# Patient Record
Sex: Female | Born: 1935 | Race: White | Hispanic: No | Smoking: Never smoker
Health system: Southern US, Community
[De-identification: ages and names within clinical notes are randomized; demographics above are authoritative.]

## PROBLEM LIST (undated history)

## (undated) DIAGNOSIS — K219 Gastro-esophageal reflux disease without esophagitis: Secondary | ICD-10-CM

## (undated) DIAGNOSIS — I4891 Unspecified atrial fibrillation: Secondary | ICD-10-CM

## (undated) DIAGNOSIS — C539 Malignant neoplasm of cervix uteri, unspecified: Secondary | ICD-10-CM

## (undated) DIAGNOSIS — E039 Hypothyroidism, unspecified: Secondary | ICD-10-CM

## (undated) DIAGNOSIS — K449 Diaphragmatic hernia without obstruction or gangrene: Secondary | ICD-10-CM

## (undated) DIAGNOSIS — I10 Essential (primary) hypertension: Secondary | ICD-10-CM

## (undated) DIAGNOSIS — K5792 Diverticulitis of intestine, part unspecified, without perforation or abscess without bleeding: Secondary | ICD-10-CM

## (undated) HISTORY — DX: Malignant neoplasm of cervix uteri, unspecified: C53.9

## (undated) HISTORY — DX: Unspecified atrial fibrillation: I48.91

## (undated) HISTORY — DX: Diaphragmatic hernia without obstruction or gangrene: K44.9

## (undated) HISTORY — DX: Gastro-esophageal reflux disease without esophagitis: K21.9

## (undated) HISTORY — PX: OOPHORECTOMY: SHX86

## (undated) HISTORY — PX: BREAST LUMPECTOMY: SHX2

## (undated) HISTORY — DX: Hypothyroidism, unspecified: E03.9

## (undated) HISTORY — PX: OTHER SURGICAL HISTORY: SHX169

## (undated) HISTORY — PX: VAGINAL HYSTERECTOMY: SHX2639

## (undated) HISTORY — DX: Essential (primary) hypertension: I10

---

## 2011-11-30 DIAGNOSIS — L8992 Pressure ulcer of unspecified site, stage 2: Secondary | ICD-10-CM | POA: Diagnosis not present

## 2011-11-30 DIAGNOSIS — L89509 Pressure ulcer of unspecified ankle, unspecified stage: Secondary | ICD-10-CM | POA: Diagnosis not present

## 2011-12-01 DIAGNOSIS — H2510 Age-related nuclear cataract, unspecified eye: Secondary | ICD-10-CM | POA: Diagnosis not present

## 2011-12-05 DIAGNOSIS — I8310 Varicose veins of unspecified lower extremity with inflammation: Secondary | ICD-10-CM | POA: Diagnosis not present

## 2011-12-05 DIAGNOSIS — R609 Edema, unspecified: Secondary | ICD-10-CM | POA: Diagnosis not present

## 2011-12-05 DIAGNOSIS — L97909 Non-pressure chronic ulcer of unspecified part of unspecified lower leg with unspecified severity: Secondary | ICD-10-CM | POA: Diagnosis not present

## 2011-12-12 DIAGNOSIS — R609 Edema, unspecified: Secondary | ICD-10-CM | POA: Diagnosis not present

## 2011-12-12 DIAGNOSIS — I8310 Varicose veins of unspecified lower extremity with inflammation: Secondary | ICD-10-CM | POA: Diagnosis not present

## 2011-12-12 DIAGNOSIS — L97909 Non-pressure chronic ulcer of unspecified part of unspecified lower leg with unspecified severity: Secondary | ICD-10-CM | POA: Diagnosis not present

## 2011-12-21 DIAGNOSIS — R040 Epistaxis: Secondary | ICD-10-CM | POA: Diagnosis not present

## 2012-01-03 DIAGNOSIS — R040 Epistaxis: Secondary | ICD-10-CM | POA: Diagnosis not present

## 2012-01-04 DIAGNOSIS — L97909 Non-pressure chronic ulcer of unspecified part of unspecified lower leg with unspecified severity: Secondary | ICD-10-CM | POA: Diagnosis not present

## 2012-01-09 DIAGNOSIS — S63509A Unspecified sprain of unspecified wrist, initial encounter: Secondary | ICD-10-CM | POA: Diagnosis not present

## 2012-01-22 DIAGNOSIS — R609 Edema, unspecified: Secondary | ICD-10-CM | POA: Diagnosis not present

## 2012-01-22 DIAGNOSIS — L03119 Cellulitis of unspecified part of limb: Secondary | ICD-10-CM | POA: Diagnosis not present

## 2012-01-22 DIAGNOSIS — L97909 Non-pressure chronic ulcer of unspecified part of unspecified lower leg with unspecified severity: Secondary | ICD-10-CM | POA: Diagnosis not present

## 2012-01-22 DIAGNOSIS — L02419 Cutaneous abscess of limb, unspecified: Secondary | ICD-10-CM | POA: Diagnosis not present

## 2012-01-26 DIAGNOSIS — L02419 Cutaneous abscess of limb, unspecified: Secondary | ICD-10-CM | POA: Diagnosis not present

## 2012-01-26 DIAGNOSIS — L0390 Cellulitis, unspecified: Secondary | ICD-10-CM | POA: Diagnosis not present

## 2012-01-26 DIAGNOSIS — L0291 Cutaneous abscess, unspecified: Secondary | ICD-10-CM | POA: Diagnosis not present

## 2012-01-26 DIAGNOSIS — L97909 Non-pressure chronic ulcer of unspecified part of unspecified lower leg with unspecified severity: Secondary | ICD-10-CM | POA: Diagnosis not present

## 2012-01-26 DIAGNOSIS — L03119 Cellulitis of unspecified part of limb: Secondary | ICD-10-CM | POA: Diagnosis not present

## 2012-01-30 DIAGNOSIS — L03119 Cellulitis of unspecified part of limb: Secondary | ICD-10-CM | POA: Diagnosis not present

## 2012-01-30 DIAGNOSIS — L02419 Cutaneous abscess of limb, unspecified: Secondary | ICD-10-CM | POA: Diagnosis not present

## 2012-02-06 DIAGNOSIS — L03119 Cellulitis of unspecified part of limb: Secondary | ICD-10-CM | POA: Diagnosis not present

## 2012-02-06 DIAGNOSIS — L02419 Cutaneous abscess of limb, unspecified: Secondary | ICD-10-CM | POA: Diagnosis not present

## 2012-02-06 DIAGNOSIS — L97909 Non-pressure chronic ulcer of unspecified part of unspecified lower leg with unspecified severity: Secondary | ICD-10-CM | POA: Diagnosis not present

## 2012-02-23 DIAGNOSIS — R040 Epistaxis: Secondary | ICD-10-CM | POA: Diagnosis not present

## 2012-03-18 DIAGNOSIS — B029 Zoster without complications: Secondary | ICD-10-CM | POA: Diagnosis not present

## 2012-09-20 DIAGNOSIS — T6391XA Toxic effect of contact with unspecified venomous animal, accidental (unintentional), initial encounter: Secondary | ICD-10-CM | POA: Diagnosis not present

## 2012-12-06 DIAGNOSIS — H2510 Age-related nuclear cataract, unspecified eye: Secondary | ICD-10-CM | POA: Diagnosis not present

## 2013-01-23 DIAGNOSIS — H6040 Cholesteatoma of external ear, unspecified ear: Secondary | ICD-10-CM | POA: Diagnosis not present

## 2013-01-23 DIAGNOSIS — H60399 Other infective otitis externa, unspecified ear: Secondary | ICD-10-CM | POA: Diagnosis not present

## 2013-01-23 DIAGNOSIS — J029 Acute pharyngitis, unspecified: Secondary | ICD-10-CM | POA: Diagnosis not present

## 2013-02-06 DIAGNOSIS — R1314 Dysphagia, pharyngoesophageal phase: Secondary | ICD-10-CM | POA: Diagnosis not present

## 2013-02-06 DIAGNOSIS — H60399 Other infective otitis externa, unspecified ear: Secondary | ICD-10-CM | POA: Diagnosis not present

## 2013-02-19 DIAGNOSIS — R1314 Dysphagia, pharyngoesophageal phase: Secondary | ICD-10-CM | POA: Diagnosis not present

## 2013-02-19 DIAGNOSIS — IMO0001 Reserved for inherently not codable concepts without codable children: Secondary | ICD-10-CM | POA: Diagnosis not present

## 2013-03-10 DIAGNOSIS — R1314 Dysphagia, pharyngoesophageal phase: Secondary | ICD-10-CM | POA: Diagnosis not present

## 2013-03-10 DIAGNOSIS — H60399 Other infective otitis externa, unspecified ear: Secondary | ICD-10-CM | POA: Diagnosis not present

## 2013-03-21 DIAGNOSIS — R1310 Dysphagia, unspecified: Secondary | ICD-10-CM | POA: Diagnosis not present

## 2013-03-21 DIAGNOSIS — K219 Gastro-esophageal reflux disease without esophagitis: Secondary | ICD-10-CM | POA: Diagnosis not present

## 2013-03-26 DIAGNOSIS — R1310 Dysphagia, unspecified: Secondary | ICD-10-CM | POA: Diagnosis not present

## 2013-03-26 DIAGNOSIS — K224 Dyskinesia of esophagus: Secondary | ICD-10-CM | POA: Diagnosis not present

## 2013-03-26 DIAGNOSIS — K449 Diaphragmatic hernia without obstruction or gangrene: Secondary | ICD-10-CM | POA: Diagnosis not present

## 2013-06-09 DIAGNOSIS — H2700 Aphakia, unspecified eye: Secondary | ICD-10-CM | POA: Diagnosis not present

## 2013-07-03 DIAGNOSIS — Z7982 Long term (current) use of aspirin: Secondary | ICD-10-CM | POA: Diagnosis not present

## 2013-07-03 DIAGNOSIS — J342 Deviated nasal septum: Secondary | ICD-10-CM | POA: Diagnosis not present

## 2013-07-03 DIAGNOSIS — R040 Epistaxis: Secondary | ICD-10-CM | POA: Diagnosis not present

## 2013-07-03 DIAGNOSIS — I10 Essential (primary) hypertension: Secondary | ICD-10-CM | POA: Diagnosis not present

## 2013-07-04 DIAGNOSIS — I10 Essential (primary) hypertension: Secondary | ICD-10-CM | POA: Diagnosis not present

## 2013-07-11 DIAGNOSIS — R040 Epistaxis: Secondary | ICD-10-CM | POA: Diagnosis not present

## 2013-08-13 DIAGNOSIS — E559 Vitamin D deficiency, unspecified: Secondary | ICD-10-CM | POA: Diagnosis not present

## 2013-08-13 DIAGNOSIS — M1990 Unspecified osteoarthritis, unspecified site: Secondary | ICD-10-CM | POA: Diagnosis not present

## 2013-08-13 DIAGNOSIS — I10 Essential (primary) hypertension: Secondary | ICD-10-CM | POA: Diagnosis not present

## 2013-09-05 DIAGNOSIS — R1310 Dysphagia, unspecified: Secondary | ICD-10-CM | POA: Diagnosis not present

## 2013-09-05 DIAGNOSIS — H6040 Cholesteatoma of external ear, unspecified ear: Secondary | ICD-10-CM | POA: Diagnosis not present

## 2013-09-19 DIAGNOSIS — I10 Essential (primary) hypertension: Secondary | ICD-10-CM | POA: Diagnosis not present

## 2013-09-19 DIAGNOSIS — E559 Vitamin D deficiency, unspecified: Secondary | ICD-10-CM | POA: Diagnosis not present

## 2013-10-22 DIAGNOSIS — I10 Essential (primary) hypertension: Secondary | ICD-10-CM | POA: Diagnosis not present

## 2013-10-22 DIAGNOSIS — M1990 Unspecified osteoarthritis, unspecified site: Secondary | ICD-10-CM | POA: Diagnosis not present

## 2013-10-22 DIAGNOSIS — E559 Vitamin D deficiency, unspecified: Secondary | ICD-10-CM | POA: Diagnosis not present

## 2013-12-23 DIAGNOSIS — M25519 Pain in unspecified shoulder: Secondary | ICD-10-CM | POA: Diagnosis not present

## 2013-12-23 DIAGNOSIS — E559 Vitamin D deficiency, unspecified: Secondary | ICD-10-CM | POA: Diagnosis not present

## 2013-12-23 DIAGNOSIS — I10 Essential (primary) hypertension: Secondary | ICD-10-CM | POA: Diagnosis not present

## 2013-12-23 DIAGNOSIS — M1990 Unspecified osteoarthritis, unspecified site: Secondary | ICD-10-CM | POA: Diagnosis not present

## 2014-02-23 DIAGNOSIS — I10 Essential (primary) hypertension: Secondary | ICD-10-CM | POA: Diagnosis not present

## 2014-02-23 DIAGNOSIS — M1990 Unspecified osteoarthritis, unspecified site: Secondary | ICD-10-CM | POA: Diagnosis not present

## 2014-02-23 DIAGNOSIS — E559 Vitamin D deficiency, unspecified: Secondary | ICD-10-CM | POA: Diagnosis not present

## 2014-03-13 DIAGNOSIS — R1319 Other dysphagia: Secondary | ICD-10-CM | POA: Diagnosis not present

## 2014-03-13 DIAGNOSIS — T18128A Food in esophagus causing other injury, initial encounter: Secondary | ICD-10-CM | POA: Diagnosis not present

## 2014-03-13 DIAGNOSIS — I10 Essential (primary) hypertension: Secondary | ICD-10-CM | POA: Diagnosis not present

## 2014-03-13 DIAGNOSIS — R1310 Dysphagia, unspecified: Secondary | ICD-10-CM | POA: Diagnosis not present

## 2014-03-13 DIAGNOSIS — T18108A Unspecified foreign body in esophagus causing other injury, initial encounter: Secondary | ICD-10-CM | POA: Diagnosis not present

## 2014-03-13 DIAGNOSIS — J029 Acute pharyngitis, unspecified: Secondary | ICD-10-CM | POA: Diagnosis not present

## 2014-03-13 DIAGNOSIS — K449 Diaphragmatic hernia without obstruction or gangrene: Secondary | ICD-10-CM | POA: Diagnosis not present

## 2014-03-15 DIAGNOSIS — T18108A Unspecified foreign body in esophagus causing other injury, initial encounter: Secondary | ICD-10-CM | POA: Diagnosis not present

## 2014-03-15 DIAGNOSIS — T18128A Food in esophagus causing other injury, initial encounter: Secondary | ICD-10-CM | POA: Diagnosis not present

## 2014-03-15 DIAGNOSIS — K449 Diaphragmatic hernia without obstruction or gangrene: Secondary | ICD-10-CM | POA: Diagnosis not present

## 2014-03-15 DIAGNOSIS — R1310 Dysphagia, unspecified: Secondary | ICD-10-CM | POA: Diagnosis not present

## 2014-03-15 DIAGNOSIS — R1319 Other dysphagia: Secondary | ICD-10-CM | POA: Diagnosis not present

## 2014-03-16 DIAGNOSIS — F419 Anxiety disorder, unspecified: Secondary | ICD-10-CM | POA: Diagnosis not present

## 2014-03-16 DIAGNOSIS — I10 Essential (primary) hypertension: Secondary | ICD-10-CM | POA: Diagnosis not present

## 2014-03-20 DIAGNOSIS — K219 Gastro-esophageal reflux disease without esophagitis: Secondary | ICD-10-CM | POA: Diagnosis not present

## 2014-03-20 DIAGNOSIS — R1314 Dysphagia, pharyngoesophageal phase: Secondary | ICD-10-CM | POA: Diagnosis not present

## 2014-03-20 DIAGNOSIS — I10 Essential (primary) hypertension: Secondary | ICD-10-CM | POA: Diagnosis not present

## 2014-04-05 DIAGNOSIS — R569 Unspecified convulsions: Secondary | ICD-10-CM | POA: Diagnosis not present

## 2014-04-05 DIAGNOSIS — I10 Essential (primary) hypertension: Secondary | ICD-10-CM | POA: Diagnosis not present

## 2014-04-05 DIAGNOSIS — R404 Transient alteration of awareness: Secondary | ICD-10-CM | POA: Diagnosis not present

## 2014-04-05 DIAGNOSIS — R42 Dizziness and giddiness: Secondary | ICD-10-CM | POA: Diagnosis not present

## 2014-04-05 DIAGNOSIS — R55 Syncope and collapse: Secondary | ICD-10-CM | POA: Diagnosis not present

## 2014-04-14 DIAGNOSIS — R1310 Dysphagia, unspecified: Secondary | ICD-10-CM | POA: Diagnosis not present

## 2014-04-14 DIAGNOSIS — I10 Essential (primary) hypertension: Secondary | ICD-10-CM | POA: Diagnosis not present

## 2014-04-30 DIAGNOSIS — K222 Esophageal obstruction: Secondary | ICD-10-CM | POA: Diagnosis not present

## 2014-04-30 DIAGNOSIS — I4891 Unspecified atrial fibrillation: Secondary | ICD-10-CM | POA: Diagnosis not present

## 2014-04-30 DIAGNOSIS — K449 Diaphragmatic hernia without obstruction or gangrene: Secondary | ICD-10-CM | POA: Diagnosis not present

## 2014-04-30 DIAGNOSIS — I10 Essential (primary) hypertension: Secondary | ICD-10-CM | POA: Diagnosis not present

## 2014-04-30 DIAGNOSIS — E039 Hypothyroidism, unspecified: Secondary | ICD-10-CM | POA: Diagnosis not present

## 2014-04-30 DIAGNOSIS — F419 Anxiety disorder, unspecified: Secondary | ICD-10-CM | POA: Diagnosis not present

## 2014-04-30 DIAGNOSIS — R1314 Dysphagia, pharyngoesophageal phase: Secondary | ICD-10-CM | POA: Diagnosis not present

## 2014-04-30 DIAGNOSIS — K219 Gastro-esophageal reflux disease without esophagitis: Secondary | ICD-10-CM | POA: Diagnosis not present

## 2014-05-04 DIAGNOSIS — R1310 Dysphagia, unspecified: Secondary | ICD-10-CM | POA: Diagnosis not present

## 2014-05-04 DIAGNOSIS — I10 Essential (primary) hypertension: Secondary | ICD-10-CM | POA: Diagnosis not present

## 2014-06-11 DIAGNOSIS — K219 Gastro-esophageal reflux disease without esophagitis: Secondary | ICD-10-CM | POA: Diagnosis not present

## 2014-06-11 DIAGNOSIS — R1314 Dysphagia, pharyngoesophageal phase: Secondary | ICD-10-CM | POA: Diagnosis not present

## 2014-06-11 DIAGNOSIS — K449 Diaphragmatic hernia without obstruction or gangrene: Secondary | ICD-10-CM | POA: Diagnosis not present

## 2014-06-11 DIAGNOSIS — I10 Essential (primary) hypertension: Secondary | ICD-10-CM | POA: Diagnosis not present

## 2014-06-11 DIAGNOSIS — E039 Hypothyroidism, unspecified: Secondary | ICD-10-CM | POA: Diagnosis not present

## 2014-06-11 DIAGNOSIS — K222 Esophageal obstruction: Secondary | ICD-10-CM | POA: Diagnosis not present

## 2014-06-11 DIAGNOSIS — I4891 Unspecified atrial fibrillation: Secondary | ICD-10-CM | POA: Diagnosis not present

## 2014-07-03 DIAGNOSIS — E559 Vitamin D deficiency, unspecified: Secondary | ICD-10-CM | POA: Diagnosis not present

## 2014-07-03 DIAGNOSIS — I10 Essential (primary) hypertension: Secondary | ICD-10-CM | POA: Diagnosis not present

## 2014-07-03 DIAGNOSIS — R1310 Dysphagia, unspecified: Secondary | ICD-10-CM | POA: Diagnosis not present

## 2014-07-03 DIAGNOSIS — M1990 Unspecified osteoarthritis, unspecified site: Secondary | ICD-10-CM | POA: Diagnosis not present

## 2014-07-09 DIAGNOSIS — R1314 Dysphagia, pharyngoesophageal phase: Secondary | ICD-10-CM | POA: Diagnosis not present

## 2014-07-09 DIAGNOSIS — K222 Esophageal obstruction: Secondary | ICD-10-CM | POA: Diagnosis not present

## 2014-07-09 DIAGNOSIS — K449 Diaphragmatic hernia without obstruction or gangrene: Secondary | ICD-10-CM | POA: Diagnosis not present

## 2014-07-09 DIAGNOSIS — Z8541 Personal history of malignant neoplasm of cervix uteri: Secondary | ICD-10-CM | POA: Diagnosis not present

## 2014-07-09 DIAGNOSIS — F419 Anxiety disorder, unspecified: Secondary | ICD-10-CM | POA: Diagnosis not present

## 2014-07-09 DIAGNOSIS — Z90710 Acquired absence of both cervix and uterus: Secondary | ICD-10-CM | POA: Diagnosis not present

## 2014-07-09 DIAGNOSIS — K219 Gastro-esophageal reflux disease without esophagitis: Secondary | ICD-10-CM | POA: Diagnosis not present

## 2014-07-09 DIAGNOSIS — I4891 Unspecified atrial fibrillation: Secondary | ICD-10-CM | POA: Diagnosis not present

## 2014-07-09 DIAGNOSIS — I10 Essential (primary) hypertension: Secondary | ICD-10-CM | POA: Diagnosis not present

## 2014-07-09 DIAGNOSIS — E039 Hypothyroidism, unspecified: Secondary | ICD-10-CM | POA: Diagnosis not present

## 2014-07-09 HISTORY — PX: ESOPHAGOGASTRODUODENOSCOPY: SHX1529

## 2014-08-04 DIAGNOSIS — K449 Diaphragmatic hernia without obstruction or gangrene: Secondary | ICD-10-CM | POA: Diagnosis not present

## 2014-08-04 DIAGNOSIS — K222 Esophageal obstruction: Secondary | ICD-10-CM | POA: Diagnosis not present

## 2014-08-27 DIAGNOSIS — H2513 Age-related nuclear cataract, bilateral: Secondary | ICD-10-CM | POA: Diagnosis not present

## 2014-09-02 DIAGNOSIS — I10 Essential (primary) hypertension: Secondary | ICD-10-CM | POA: Diagnosis not present

## 2014-09-02 DIAGNOSIS — M1990 Unspecified osteoarthritis, unspecified site: Secondary | ICD-10-CM | POA: Diagnosis not present

## 2014-09-02 DIAGNOSIS — E559 Vitamin D deficiency, unspecified: Secondary | ICD-10-CM | POA: Diagnosis not present

## 2014-09-02 DIAGNOSIS — R1310 Dysphagia, unspecified: Secondary | ICD-10-CM | POA: Diagnosis not present

## 2014-11-04 DIAGNOSIS — I10 Essential (primary) hypertension: Secondary | ICD-10-CM | POA: Diagnosis not present

## 2014-11-04 DIAGNOSIS — E559 Vitamin D deficiency, unspecified: Secondary | ICD-10-CM | POA: Diagnosis not present

## 2014-11-10 DIAGNOSIS — E039 Hypothyroidism, unspecified: Secondary | ICD-10-CM | POA: Diagnosis not present

## 2014-11-10 DIAGNOSIS — M19012 Primary osteoarthritis, left shoulder: Secondary | ICD-10-CM | POA: Diagnosis not present

## 2014-11-10 DIAGNOSIS — M179 Osteoarthritis of knee, unspecified: Secondary | ICD-10-CM | POA: Diagnosis not present

## 2014-11-10 DIAGNOSIS — I10 Essential (primary) hypertension: Secondary | ICD-10-CM | POA: Diagnosis not present

## 2014-11-10 DIAGNOSIS — S0093XA Contusion of unspecified part of head, initial encounter: Secondary | ICD-10-CM | POA: Diagnosis not present

## 2014-11-10 DIAGNOSIS — K219 Gastro-esophageal reflux disease without esophagitis: Secondary | ICD-10-CM | POA: Diagnosis not present

## 2014-11-10 DIAGNOSIS — M19011 Primary osteoarthritis, right shoulder: Secondary | ICD-10-CM | POA: Diagnosis not present

## 2014-11-10 DIAGNOSIS — M13842 Other specified arthritis, left hand: Secondary | ICD-10-CM | POA: Diagnosis not present

## 2014-11-10 DIAGNOSIS — S0990XA Unspecified injury of head, initial encounter: Secondary | ICD-10-CM | POA: Diagnosis not present

## 2014-11-10 DIAGNOSIS — M13841 Other specified arthritis, right hand: Secondary | ICD-10-CM | POA: Diagnosis not present

## 2014-11-10 DIAGNOSIS — F419 Anxiety disorder, unspecified: Secondary | ICD-10-CM | POA: Diagnosis not present

## 2015-01-06 DIAGNOSIS — E559 Vitamin D deficiency, unspecified: Secondary | ICD-10-CM | POA: Diagnosis not present

## 2015-01-06 DIAGNOSIS — R1310 Dysphagia, unspecified: Secondary | ICD-10-CM | POA: Diagnosis not present

## 2015-01-06 DIAGNOSIS — M1990 Unspecified osteoarthritis, unspecified site: Secondary | ICD-10-CM | POA: Diagnosis not present

## 2015-01-06 DIAGNOSIS — I10 Essential (primary) hypertension: Secondary | ICD-10-CM | POA: Diagnosis not present

## 2015-03-10 DIAGNOSIS — E559 Vitamin D deficiency, unspecified: Secondary | ICD-10-CM | POA: Diagnosis not present

## 2015-03-10 DIAGNOSIS — R600 Localized edema: Secondary | ICD-10-CM | POA: Diagnosis not present

## 2015-03-10 DIAGNOSIS — I10 Essential (primary) hypertension: Secondary | ICD-10-CM | POA: Diagnosis not present

## 2015-03-10 DIAGNOSIS — M1990 Unspecified osteoarthritis, unspecified site: Secondary | ICD-10-CM | POA: Diagnosis not present

## 2015-03-10 DIAGNOSIS — R1310 Dysphagia, unspecified: Secondary | ICD-10-CM | POA: Diagnosis not present

## 2015-05-11 DIAGNOSIS — R600 Localized edema: Secondary | ICD-10-CM | POA: Diagnosis not present

## 2015-05-11 DIAGNOSIS — I10 Essential (primary) hypertension: Secondary | ICD-10-CM | POA: Diagnosis not present

## 2015-05-11 DIAGNOSIS — M1990 Unspecified osteoarthritis, unspecified site: Secondary | ICD-10-CM | POA: Diagnosis not present

## 2015-05-11 DIAGNOSIS — R1310 Dysphagia, unspecified: Secondary | ICD-10-CM | POA: Diagnosis not present

## 2015-07-12 DIAGNOSIS — R600 Localized edema: Secondary | ICD-10-CM | POA: Diagnosis not present

## 2015-07-12 DIAGNOSIS — R1310 Dysphagia, unspecified: Secondary | ICD-10-CM | POA: Diagnosis not present

## 2015-07-12 DIAGNOSIS — M1990 Unspecified osteoarthritis, unspecified site: Secondary | ICD-10-CM | POA: Diagnosis not present

## 2015-07-12 DIAGNOSIS — E559 Vitamin D deficiency, unspecified: Secondary | ICD-10-CM | POA: Diagnosis not present

## 2015-07-12 DIAGNOSIS — I10 Essential (primary) hypertension: Secondary | ICD-10-CM | POA: Diagnosis not present

## 2015-08-10 DIAGNOSIS — E559 Vitamin D deficiency, unspecified: Secondary | ICD-10-CM | POA: Diagnosis not present

## 2015-08-10 DIAGNOSIS — M1990 Unspecified osteoarthritis, unspecified site: Secondary | ICD-10-CM | POA: Diagnosis not present

## 2015-08-10 DIAGNOSIS — R600 Localized edema: Secondary | ICD-10-CM | POA: Diagnosis not present

## 2015-08-10 DIAGNOSIS — I10 Essential (primary) hypertension: Secondary | ICD-10-CM | POA: Diagnosis not present

## 2015-09-23 DIAGNOSIS — H2513 Age-related nuclear cataract, bilateral: Secondary | ICD-10-CM | POA: Diagnosis not present

## 2015-11-10 DIAGNOSIS — M1990 Unspecified osteoarthritis, unspecified site: Secondary | ICD-10-CM | POA: Diagnosis not present

## 2015-11-10 DIAGNOSIS — I10 Essential (primary) hypertension: Secondary | ICD-10-CM | POA: Diagnosis not present

## 2015-11-10 DIAGNOSIS — R600 Localized edema: Secondary | ICD-10-CM | POA: Diagnosis not present

## 2015-11-10 DIAGNOSIS — R1310 Dysphagia, unspecified: Secondary | ICD-10-CM | POA: Diagnosis not present

## 2015-12-23 DIAGNOSIS — R0602 Shortness of breath: Secondary | ICD-10-CM | POA: Diagnosis not present

## 2015-12-23 DIAGNOSIS — R531 Weakness: Secondary | ICD-10-CM | POA: Diagnosis not present

## 2015-12-23 DIAGNOSIS — R42 Dizziness and giddiness: Secondary | ICD-10-CM | POA: Diagnosis not present

## 2015-12-30 DIAGNOSIS — I10 Essential (primary) hypertension: Secondary | ICD-10-CM | POA: Diagnosis not present

## 2015-12-30 DIAGNOSIS — R42 Dizziness and giddiness: Secondary | ICD-10-CM | POA: Diagnosis not present

## 2015-12-30 DIAGNOSIS — I739 Peripheral vascular disease, unspecified: Secondary | ICD-10-CM | POA: Diagnosis not present

## 2015-12-30 DIAGNOSIS — I4891 Unspecified atrial fibrillation: Secondary | ICD-10-CM | POA: Diagnosis not present

## 2016-01-03 DIAGNOSIS — R42 Dizziness and giddiness: Secondary | ICD-10-CM | POA: Diagnosis not present

## 2016-01-05 DIAGNOSIS — I10 Essential (primary) hypertension: Secondary | ICD-10-CM | POA: Diagnosis not present

## 2016-01-05 DIAGNOSIS — H918X1 Other specified hearing loss, right ear: Secondary | ICD-10-CM | POA: Diagnosis not present

## 2016-01-05 DIAGNOSIS — H9071 Mixed conductive and sensorineural hearing loss, unilateral, right ear, with unrestricted hearing on the contralateral side: Secondary | ICD-10-CM | POA: Diagnosis not present

## 2016-01-05 DIAGNOSIS — H9042 Sensorineural hearing loss, unilateral, left ear, with unrestricted hearing on the contralateral side: Secondary | ICD-10-CM | POA: Diagnosis not present

## 2016-01-05 DIAGNOSIS — R42 Dizziness and giddiness: Secondary | ICD-10-CM | POA: Diagnosis not present

## 2016-01-08 DIAGNOSIS — H81399 Other peripheral vertigo, unspecified ear: Secondary | ICD-10-CM | POA: Diagnosis not present

## 2016-01-10 DIAGNOSIS — R42 Dizziness and giddiness: Secondary | ICD-10-CM | POA: Diagnosis not present

## 2016-01-10 DIAGNOSIS — M6281 Muscle weakness (generalized): Secondary | ICD-10-CM | POA: Diagnosis not present

## 2016-01-10 DIAGNOSIS — R2689 Other abnormalities of gait and mobility: Secondary | ICD-10-CM | POA: Diagnosis not present

## 2016-01-12 DIAGNOSIS — R2689 Other abnormalities of gait and mobility: Secondary | ICD-10-CM | POA: Diagnosis not present

## 2016-01-12 DIAGNOSIS — M6281 Muscle weakness (generalized): Secondary | ICD-10-CM | POA: Diagnosis not present

## 2016-01-12 DIAGNOSIS — R42 Dizziness and giddiness: Secondary | ICD-10-CM | POA: Diagnosis not present

## 2016-01-17 DIAGNOSIS — R2689 Other abnormalities of gait and mobility: Secondary | ICD-10-CM | POA: Diagnosis not present

## 2016-01-17 DIAGNOSIS — M6281 Muscle weakness (generalized): Secondary | ICD-10-CM | POA: Diagnosis not present

## 2016-01-17 DIAGNOSIS — R42 Dizziness and giddiness: Secondary | ICD-10-CM | POA: Diagnosis not present

## 2016-01-18 DIAGNOSIS — I639 Cerebral infarction, unspecified: Secondary | ICD-10-CM | POA: Diagnosis not present

## 2016-01-18 DIAGNOSIS — H918X1 Other specified hearing loss, right ear: Secondary | ICD-10-CM | POA: Diagnosis not present

## 2016-01-19 DIAGNOSIS — I10 Essential (primary) hypertension: Secondary | ICD-10-CM | POA: Diagnosis not present

## 2016-01-19 DIAGNOSIS — I639 Cerebral infarction, unspecified: Secondary | ICD-10-CM | POA: Diagnosis not present

## 2016-01-19 DIAGNOSIS — E785 Hyperlipidemia, unspecified: Secondary | ICD-10-CM | POA: Diagnosis not present

## 2016-01-25 DIAGNOSIS — M6281 Muscle weakness (generalized): Secondary | ICD-10-CM | POA: Diagnosis not present

## 2016-01-25 DIAGNOSIS — R2689 Other abnormalities of gait and mobility: Secondary | ICD-10-CM | POA: Diagnosis not present

## 2016-01-25 DIAGNOSIS — R42 Dizziness and giddiness: Secondary | ICD-10-CM | POA: Diagnosis not present

## 2016-01-26 DIAGNOSIS — I6523 Occlusion and stenosis of bilateral carotid arteries: Secondary | ICD-10-CM | POA: Diagnosis not present

## 2016-01-26 DIAGNOSIS — I639 Cerebral infarction, unspecified: Secondary | ICD-10-CM | POA: Diagnosis not present

## 2016-01-26 DIAGNOSIS — I361 Nonrheumatic tricuspid (valve) insufficiency: Secondary | ICD-10-CM | POA: Diagnosis not present

## 2016-01-26 DIAGNOSIS — I10 Essential (primary) hypertension: Secondary | ICD-10-CM | POA: Diagnosis not present

## 2016-01-26 DIAGNOSIS — I340 Nonrheumatic mitral (valve) insufficiency: Secondary | ICD-10-CM | POA: Diagnosis not present

## 2016-01-31 DIAGNOSIS — Z6829 Body mass index (BMI) 29.0-29.9, adult: Secondary | ICD-10-CM | POA: Diagnosis not present

## 2016-01-31 DIAGNOSIS — I679 Cerebrovascular disease, unspecified: Secondary | ICD-10-CM | POA: Diagnosis not present

## 2016-01-31 DIAGNOSIS — E785 Hyperlipidemia, unspecified: Secondary | ICD-10-CM | POA: Diagnosis not present

## 2016-01-31 DIAGNOSIS — I10 Essential (primary) hypertension: Secondary | ICD-10-CM | POA: Diagnosis not present

## 2016-01-31 DIAGNOSIS — Z1389 Encounter for screening for other disorder: Secondary | ICD-10-CM | POA: Diagnosis not present

## 2016-02-07 DIAGNOSIS — I10 Essential (primary) hypertension: Secondary | ICD-10-CM | POA: Diagnosis not present

## 2016-02-07 DIAGNOSIS — R42 Dizziness and giddiness: Secondary | ICD-10-CM | POA: Diagnosis not present

## 2016-02-07 DIAGNOSIS — I639 Cerebral infarction, unspecified: Secondary | ICD-10-CM | POA: Diagnosis not present

## 2016-02-07 DIAGNOSIS — I679 Cerebrovascular disease, unspecified: Secondary | ICD-10-CM | POA: Diagnosis not present

## 2016-02-07 DIAGNOSIS — Z8673 Personal history of transient ischemic attack (TIA), and cerebral infarction without residual deficits: Secondary | ICD-10-CM | POA: Diagnosis not present

## 2016-02-07 DIAGNOSIS — E785 Hyperlipidemia, unspecified: Secondary | ICD-10-CM | POA: Diagnosis not present

## 2016-03-02 DIAGNOSIS — I10 Essential (primary) hypertension: Secondary | ICD-10-CM | POA: Diagnosis not present

## 2016-03-02 DIAGNOSIS — M25561 Pain in right knee: Secondary | ICD-10-CM | POA: Diagnosis not present

## 2016-03-02 DIAGNOSIS — E785 Hyperlipidemia, unspecified: Secondary | ICD-10-CM | POA: Diagnosis not present

## 2016-03-02 DIAGNOSIS — Z6829 Body mass index (BMI) 29.0-29.9, adult: Secondary | ICD-10-CM | POA: Diagnosis not present

## 2016-03-02 DIAGNOSIS — M25562 Pain in left knee: Secondary | ICD-10-CM | POA: Diagnosis not present

## 2016-03-02 DIAGNOSIS — R262 Difficulty in walking, not elsewhere classified: Secondary | ICD-10-CM | POA: Diagnosis not present

## 2016-04-04 DIAGNOSIS — E785 Hyperlipidemia, unspecified: Secondary | ICD-10-CM | POA: Diagnosis not present

## 2016-04-04 DIAGNOSIS — R262 Difficulty in walking, not elsewhere classified: Secondary | ICD-10-CM | POA: Diagnosis not present

## 2016-04-04 DIAGNOSIS — M5440 Lumbago with sciatica, unspecified side: Secondary | ICD-10-CM | POA: Diagnosis not present

## 2016-04-04 DIAGNOSIS — I10 Essential (primary) hypertension: Secondary | ICD-10-CM | POA: Diagnosis not present

## 2016-06-06 DIAGNOSIS — E785 Hyperlipidemia, unspecified: Secondary | ICD-10-CM | POA: Diagnosis not present

## 2016-06-06 DIAGNOSIS — R42 Dizziness and giddiness: Secondary | ICD-10-CM | POA: Diagnosis not present

## 2016-06-06 DIAGNOSIS — I10 Essential (primary) hypertension: Secondary | ICD-10-CM | POA: Diagnosis not present

## 2016-07-17 DIAGNOSIS — H6120 Impacted cerumen, unspecified ear: Secondary | ICD-10-CM | POA: Diagnosis not present

## 2016-07-17 DIAGNOSIS — H938X3 Other specified disorders of ear, bilateral: Secondary | ICD-10-CM | POA: Diagnosis not present

## 2016-07-17 DIAGNOSIS — R42 Dizziness and giddiness: Secondary | ICD-10-CM | POA: Diagnosis not present

## 2016-08-08 DIAGNOSIS — R42 Dizziness and giddiness: Secondary | ICD-10-CM | POA: Diagnosis not present

## 2016-08-08 DIAGNOSIS — I10 Essential (primary) hypertension: Secondary | ICD-10-CM | POA: Diagnosis not present

## 2016-08-08 DIAGNOSIS — E785 Hyperlipidemia, unspecified: Secondary | ICD-10-CM | POA: Diagnosis not present

## 2016-09-06 DIAGNOSIS — M25511 Pain in right shoulder: Secondary | ICD-10-CM | POA: Diagnosis not present

## 2016-09-06 DIAGNOSIS — I10 Essential (primary) hypertension: Secondary | ICD-10-CM | POA: Diagnosis not present

## 2016-09-06 DIAGNOSIS — R42 Dizziness and giddiness: Secondary | ICD-10-CM | POA: Diagnosis not present

## 2016-09-06 DIAGNOSIS — E785 Hyperlipidemia, unspecified: Secondary | ICD-10-CM | POA: Diagnosis not present

## 2016-09-28 DIAGNOSIS — H2513 Age-related nuclear cataract, bilateral: Secondary | ICD-10-CM | POA: Diagnosis not present

## 2016-10-09 DIAGNOSIS — I10 Essential (primary) hypertension: Secondary | ICD-10-CM | POA: Diagnosis not present

## 2016-10-09 DIAGNOSIS — R600 Localized edema: Secondary | ICD-10-CM | POA: Diagnosis not present

## 2016-10-09 DIAGNOSIS — I878 Other specified disorders of veins: Secondary | ICD-10-CM | POA: Diagnosis not present

## 2016-10-16 DIAGNOSIS — I878 Other specified disorders of veins: Secondary | ICD-10-CM | POA: Diagnosis not present

## 2016-10-16 DIAGNOSIS — R600 Localized edema: Secondary | ICD-10-CM | POA: Diagnosis not present

## 2016-10-20 DIAGNOSIS — M7989 Other specified soft tissue disorders: Secondary | ICD-10-CM | POA: Diagnosis not present

## 2016-10-20 DIAGNOSIS — L03115 Cellulitis of right lower limb: Secondary | ICD-10-CM | POA: Diagnosis not present

## 2016-10-20 DIAGNOSIS — K219 Gastro-esophageal reflux disease without esophagitis: Secondary | ICD-10-CM | POA: Diagnosis not present

## 2016-10-20 DIAGNOSIS — I10 Essential (primary) hypertension: Secondary | ICD-10-CM | POA: Diagnosis not present

## 2016-10-20 DIAGNOSIS — Z789 Other specified health status: Secondary | ICD-10-CM | POA: Diagnosis not present

## 2016-10-21 DIAGNOSIS — I10 Essential (primary) hypertension: Secondary | ICD-10-CM | POA: Diagnosis not present

## 2016-10-21 DIAGNOSIS — L03115 Cellulitis of right lower limb: Secondary | ICD-10-CM | POA: Diagnosis not present

## 2016-10-21 DIAGNOSIS — K219 Gastro-esophageal reflux disease without esophagitis: Secondary | ICD-10-CM | POA: Diagnosis not present

## 2016-10-22 DIAGNOSIS — K219 Gastro-esophageal reflux disease without esophagitis: Secondary | ICD-10-CM | POA: Diagnosis not present

## 2016-10-22 DIAGNOSIS — K222 Esophageal obstruction: Secondary | ICD-10-CM | POA: Diagnosis present

## 2016-10-22 DIAGNOSIS — Z885 Allergy status to narcotic agent status: Secondary | ICD-10-CM | POA: Diagnosis not present

## 2016-10-22 DIAGNOSIS — Z882 Allergy status to sulfonamides status: Secondary | ICD-10-CM | POA: Diagnosis not present

## 2016-10-22 DIAGNOSIS — M79604 Pain in right leg: Secondary | ICD-10-CM | POA: Diagnosis not present

## 2016-10-22 DIAGNOSIS — L97819 Non-pressure chronic ulcer of other part of right lower leg with unspecified severity: Secondary | ICD-10-CM | POA: Diagnosis present

## 2016-10-22 DIAGNOSIS — L03115 Cellulitis of right lower limb: Secondary | ICD-10-CM | POA: Diagnosis not present

## 2016-10-22 DIAGNOSIS — E039 Hypothyroidism, unspecified: Secondary | ICD-10-CM | POA: Diagnosis present

## 2016-10-22 DIAGNOSIS — I872 Venous insufficiency (chronic) (peripheral): Secondary | ICD-10-CM | POA: Diagnosis present

## 2016-10-22 DIAGNOSIS — Z881 Allergy status to other antibiotic agents status: Secondary | ICD-10-CM | POA: Diagnosis not present

## 2016-10-22 DIAGNOSIS — Z880 Allergy status to penicillin: Secondary | ICD-10-CM | POA: Diagnosis not present

## 2016-10-22 DIAGNOSIS — F419 Anxiety disorder, unspecified: Secondary | ICD-10-CM | POA: Diagnosis present

## 2016-10-22 DIAGNOSIS — I739 Peripheral vascular disease, unspecified: Secondary | ICD-10-CM | POA: Diagnosis not present

## 2016-10-22 DIAGNOSIS — E876 Hypokalemia: Secondary | ICD-10-CM | POA: Diagnosis present

## 2016-10-22 DIAGNOSIS — M1990 Unspecified osteoarthritis, unspecified site: Secondary | ICD-10-CM | POA: Diagnosis present

## 2016-10-22 DIAGNOSIS — Z79899 Other long term (current) drug therapy: Secondary | ICD-10-CM | POA: Diagnosis not present

## 2016-10-22 DIAGNOSIS — Z7982 Long term (current) use of aspirin: Secondary | ICD-10-CM | POA: Diagnosis not present

## 2016-10-22 DIAGNOSIS — I10 Essential (primary) hypertension: Secondary | ICD-10-CM | POA: Diagnosis not present

## 2016-10-26 DIAGNOSIS — L03115 Cellulitis of right lower limb: Secondary | ICD-10-CM | POA: Diagnosis not present

## 2016-10-26 DIAGNOSIS — L97819 Non-pressure chronic ulcer of other part of right lower leg with unspecified severity: Secondary | ICD-10-CM | POA: Diagnosis not present

## 2016-10-26 DIAGNOSIS — I87311 Chronic venous hypertension (idiopathic) with ulcer of right lower extremity: Secondary | ICD-10-CM | POA: Diagnosis not present

## 2016-10-26 DIAGNOSIS — L97812 Non-pressure chronic ulcer of other part of right lower leg with fat layer exposed: Secondary | ICD-10-CM | POA: Diagnosis not present

## 2016-10-26 DIAGNOSIS — I87331 Chronic venous hypertension (idiopathic) with ulcer and inflammation of right lower extremity: Secondary | ICD-10-CM | POA: Diagnosis not present

## 2016-10-30 DIAGNOSIS — L97819 Non-pressure chronic ulcer of other part of right lower leg with unspecified severity: Secondary | ICD-10-CM | POA: Diagnosis not present

## 2016-10-30 DIAGNOSIS — I87311 Chronic venous hypertension (idiopathic) with ulcer of right lower extremity: Secondary | ICD-10-CM | POA: Diagnosis not present

## 2016-11-02 DIAGNOSIS — L03115 Cellulitis of right lower limb: Secondary | ICD-10-CM | POA: Diagnosis not present

## 2016-11-02 DIAGNOSIS — Z23 Encounter for immunization: Secondary | ICD-10-CM | POA: Diagnosis not present

## 2016-11-02 DIAGNOSIS — S61412A Laceration without foreign body of left hand, initial encounter: Secondary | ICD-10-CM | POA: Diagnosis not present

## 2016-12-04 DIAGNOSIS — L03116 Cellulitis of left lower limb: Secondary | ICD-10-CM | POA: Diagnosis not present

## 2016-12-04 DIAGNOSIS — I10 Essential (primary) hypertension: Secondary | ICD-10-CM | POA: Diagnosis not present

## 2016-12-04 DIAGNOSIS — I83009 Varicose veins of unspecified lower extremity with ulcer of unspecified site: Secondary | ICD-10-CM | POA: Diagnosis not present

## 2016-12-04 DIAGNOSIS — L97909 Non-pressure chronic ulcer of unspecified part of unspecified lower leg with unspecified severity: Secondary | ICD-10-CM | POA: Diagnosis not present

## 2016-12-05 DIAGNOSIS — R600 Localized edema: Secondary | ICD-10-CM | POA: Diagnosis not present

## 2016-12-05 DIAGNOSIS — L03116 Cellulitis of left lower limb: Secondary | ICD-10-CM | POA: Diagnosis not present

## 2016-12-13 DIAGNOSIS — M1990 Unspecified osteoarthritis, unspecified site: Secondary | ICD-10-CM | POA: Diagnosis not present

## 2016-12-13 DIAGNOSIS — Z885 Allergy status to narcotic agent status: Secondary | ICD-10-CM | POA: Diagnosis not present

## 2016-12-13 DIAGNOSIS — I10 Essential (primary) hypertension: Secondary | ICD-10-CM | POA: Diagnosis not present

## 2016-12-13 DIAGNOSIS — I872 Venous insufficiency (chronic) (peripheral): Secondary | ICD-10-CM | POA: Diagnosis not present

## 2016-12-13 DIAGNOSIS — E785 Hyperlipidemia, unspecified: Secondary | ICD-10-CM | POA: Diagnosis not present

## 2016-12-13 DIAGNOSIS — Z882 Allergy status to sulfonamides status: Secondary | ICD-10-CM | POA: Diagnosis not present

## 2016-12-13 DIAGNOSIS — L97322 Non-pressure chronic ulcer of left ankle with fat layer exposed: Secondary | ICD-10-CM | POA: Diagnosis not present

## 2016-12-13 DIAGNOSIS — Z7982 Long term (current) use of aspirin: Secondary | ICD-10-CM | POA: Diagnosis not present

## 2016-12-13 DIAGNOSIS — Z8673 Personal history of transient ischemic attack (TIA), and cerebral infarction without residual deficits: Secondary | ICD-10-CM | POA: Diagnosis not present

## 2016-12-27 DIAGNOSIS — T782XXA Anaphylactic shock, unspecified, initial encounter: Secondary | ICD-10-CM | POA: Diagnosis not present

## 2016-12-27 DIAGNOSIS — T63441A Toxic effect of venom of bees, accidental (unintentional), initial encounter: Secondary | ICD-10-CM | POA: Diagnosis not present

## 2017-01-04 DIAGNOSIS — I83009 Varicose veins of unspecified lower extremity with ulcer of unspecified site: Secondary | ICD-10-CM | POA: Diagnosis not present

## 2017-01-04 DIAGNOSIS — L97909 Non-pressure chronic ulcer of unspecified part of unspecified lower leg with unspecified severity: Secondary | ICD-10-CM | POA: Diagnosis not present

## 2017-01-29 DIAGNOSIS — R21 Rash and other nonspecific skin eruption: Secondary | ICD-10-CM | POA: Diagnosis not present

## 2017-02-21 DIAGNOSIS — L259 Unspecified contact dermatitis, unspecified cause: Secondary | ICD-10-CM | POA: Diagnosis not present

## 2017-03-02 DIAGNOSIS — L509 Urticaria, unspecified: Secondary | ICD-10-CM | POA: Diagnosis not present

## 2017-03-02 DIAGNOSIS — L508 Other urticaria: Secondary | ICD-10-CM | POA: Diagnosis not present

## 2017-03-19 DIAGNOSIS — I872 Venous insufficiency (chronic) (peripheral): Secondary | ICD-10-CM | POA: Diagnosis not present

## 2017-03-19 DIAGNOSIS — R21 Rash and other nonspecific skin eruption: Secondary | ICD-10-CM | POA: Diagnosis not present

## 2017-03-19 DIAGNOSIS — R600 Localized edema: Secondary | ICD-10-CM | POA: Diagnosis not present

## 2017-04-23 DIAGNOSIS — E785 Hyperlipidemia, unspecified: Secondary | ICD-10-CM | POA: Diagnosis not present

## 2017-04-23 DIAGNOSIS — I10 Essential (primary) hypertension: Secondary | ICD-10-CM | POA: Diagnosis not present

## 2017-04-23 DIAGNOSIS — M25561 Pain in right knee: Secondary | ICD-10-CM | POA: Diagnosis not present

## 2017-05-05 DIAGNOSIS — H2513 Age-related nuclear cataract, bilateral: Secondary | ICD-10-CM | POA: Diagnosis not present

## 2017-06-25 DIAGNOSIS — M1990 Unspecified osteoarthritis, unspecified site: Secondary | ICD-10-CM | POA: Diagnosis not present

## 2017-06-25 DIAGNOSIS — E785 Hyperlipidemia, unspecified: Secondary | ICD-10-CM | POA: Diagnosis not present

## 2017-06-25 DIAGNOSIS — Z6830 Body mass index (BMI) 30.0-30.9, adult: Secondary | ICD-10-CM | POA: Diagnosis not present

## 2017-06-25 DIAGNOSIS — I10 Essential (primary) hypertension: Secondary | ICD-10-CM | POA: Diagnosis not present

## 2017-06-25 DIAGNOSIS — E559 Vitamin D deficiency, unspecified: Secondary | ICD-10-CM | POA: Diagnosis not present

## 2017-07-27 DIAGNOSIS — W57XXXA Bitten or stung by nonvenomous insect and other nonvenomous arthropods, initial encounter: Secondary | ICD-10-CM | POA: Diagnosis not present

## 2017-07-27 DIAGNOSIS — I10 Essential (primary) hypertension: Secondary | ICD-10-CM | POA: Diagnosis not present

## 2017-08-27 DIAGNOSIS — L03116 Cellulitis of left lower limb: Secondary | ICD-10-CM | POA: Diagnosis not present

## 2017-09-03 DIAGNOSIS — I10 Essential (primary) hypertension: Secondary | ICD-10-CM | POA: Diagnosis not present

## 2017-09-03 DIAGNOSIS — L03116 Cellulitis of left lower limb: Secondary | ICD-10-CM | POA: Diagnosis not present

## 2017-09-26 DIAGNOSIS — M25562 Pain in left knee: Secondary | ICD-10-CM | POA: Diagnosis not present

## 2017-09-26 DIAGNOSIS — R600 Localized edema: Secondary | ICD-10-CM | POA: Diagnosis not present

## 2017-09-26 DIAGNOSIS — E785 Hyperlipidemia, unspecified: Secondary | ICD-10-CM | POA: Diagnosis not present

## 2017-09-26 DIAGNOSIS — M25561 Pain in right knee: Secondary | ICD-10-CM | POA: Diagnosis not present

## 2017-09-26 DIAGNOSIS — I10 Essential (primary) hypertension: Secondary | ICD-10-CM | POA: Diagnosis not present

## 2017-11-01 DIAGNOSIS — H2513 Age-related nuclear cataract, bilateral: Secondary | ICD-10-CM | POA: Diagnosis not present

## 2017-11-28 DIAGNOSIS — Z7902 Long term (current) use of antithrombotics/antiplatelets: Secondary | ICD-10-CM | POA: Diagnosis not present

## 2017-11-28 DIAGNOSIS — K449 Diaphragmatic hernia without obstruction or gangrene: Secondary | ICD-10-CM | POA: Diagnosis not present

## 2017-11-28 DIAGNOSIS — I4891 Unspecified atrial fibrillation: Secondary | ICD-10-CM | POA: Diagnosis not present

## 2017-11-28 DIAGNOSIS — K668 Other specified disorders of peritoneum: Secondary | ICD-10-CM | POA: Diagnosis not present

## 2017-11-28 DIAGNOSIS — Z9049 Acquired absence of other specified parts of digestive tract: Secondary | ICD-10-CM | POA: Diagnosis not present

## 2017-11-28 DIAGNOSIS — Z66 Do not resuscitate: Secondary | ICD-10-CM | POA: Diagnosis not present

## 2017-11-28 DIAGNOSIS — Z7982 Long term (current) use of aspirin: Secondary | ICD-10-CM | POA: Diagnosis not present

## 2017-11-28 DIAGNOSIS — R0603 Acute respiratory distress: Secondary | ICD-10-CM | POA: Diagnosis not present

## 2017-11-28 DIAGNOSIS — K631 Perforation of intestine (nontraumatic): Secondary | ICD-10-CM | POA: Diagnosis not present

## 2017-11-28 DIAGNOSIS — J90 Pleural effusion, not elsewhere classified: Secondary | ICD-10-CM | POA: Diagnosis not present

## 2017-11-28 DIAGNOSIS — J189 Pneumonia, unspecified organism: Secondary | ICD-10-CM | POA: Diagnosis not present

## 2017-11-28 DIAGNOSIS — R1110 Vomiting, unspecified: Secondary | ICD-10-CM | POA: Diagnosis not present

## 2017-11-28 DIAGNOSIS — E039 Hypothyroidism, unspecified: Secondary | ICD-10-CM | POA: Diagnosis present

## 2017-11-28 DIAGNOSIS — R110 Nausea: Secondary | ICD-10-CM | POA: Diagnosis not present

## 2017-11-28 DIAGNOSIS — Z79899 Other long term (current) drug therapy: Secondary | ICD-10-CM | POA: Diagnosis not present

## 2017-11-28 DIAGNOSIS — J9811 Atelectasis: Secondary | ICD-10-CM | POA: Diagnosis not present

## 2017-11-28 DIAGNOSIS — Z8719 Personal history of other diseases of the digestive system: Secondary | ICD-10-CM | POA: Diagnosis not present

## 2017-11-28 DIAGNOSIS — F419 Anxiety disorder, unspecified: Secondary | ICD-10-CM | POA: Diagnosis present

## 2017-11-28 DIAGNOSIS — K5792 Diverticulitis of intestine, part unspecified, without perforation or abscess without bleeding: Secondary | ICD-10-CM | POA: Diagnosis not present

## 2017-11-28 DIAGNOSIS — R1030 Lower abdominal pain, unspecified: Secondary | ICD-10-CM | POA: Diagnosis not present

## 2017-11-28 DIAGNOSIS — K5790 Diverticulosis of intestine, part unspecified, without perforation or abscess without bleeding: Secondary | ICD-10-CM | POA: Diagnosis not present

## 2017-11-28 DIAGNOSIS — K5720 Diverticulitis of large intestine with perforation and abscess without bleeding: Secondary | ICD-10-CM | POA: Diagnosis present

## 2017-11-28 DIAGNOSIS — Z933 Colostomy status: Secondary | ICD-10-CM | POA: Diagnosis not present

## 2017-11-28 DIAGNOSIS — K219 Gastro-esophageal reflux disease without esophagitis: Secondary | ICD-10-CM | POA: Diagnosis present

## 2017-11-28 DIAGNOSIS — E876 Hypokalemia: Secondary | ICD-10-CM | POA: Diagnosis not present

## 2017-11-28 DIAGNOSIS — R1310 Dysphagia, unspecified: Secondary | ICD-10-CM | POA: Diagnosis present

## 2017-11-28 DIAGNOSIS — G9341 Metabolic encephalopathy: Secondary | ICD-10-CM | POA: Diagnosis not present

## 2017-11-28 DIAGNOSIS — J9601 Acute respiratory failure with hypoxia: Secondary | ICD-10-CM | POA: Diagnosis not present

## 2017-11-28 DIAGNOSIS — I10 Essential (primary) hypertension: Secondary | ICD-10-CM | POA: Diagnosis present

## 2017-11-28 DIAGNOSIS — Z8673 Personal history of transient ischemic attack (TIA), and cerebral infarction without residual deficits: Secondary | ICD-10-CM | POA: Diagnosis not present

## 2017-11-28 DIAGNOSIS — K567 Ileus, unspecified: Secondary | ICD-10-CM | POA: Diagnosis not present

## 2017-11-29 DIAGNOSIS — K5792 Diverticulitis of intestine, part unspecified, without perforation or abscess without bleeding: Secondary | ICD-10-CM

## 2017-11-29 HISTORY — DX: Diverticulitis of intestine, part unspecified, without perforation or abscess without bleeding: K57.92

## 2017-11-29 HISTORY — PX: COLON RESECTION SIGMOID: SHX6737

## 2017-12-02 DIAGNOSIS — K5792 Diverticulitis of intestine, part unspecified, without perforation or abscess without bleeding: Secondary | ICD-10-CM

## 2017-12-10 DIAGNOSIS — I10 Essential (primary) hypertension: Secondary | ICD-10-CM | POA: Diagnosis not present

## 2017-12-10 DIAGNOSIS — E872 Acidosis: Secondary | ICD-10-CM | POA: Diagnosis not present

## 2017-12-10 DIAGNOSIS — Z7982 Long term (current) use of aspirin: Secondary | ICD-10-CM | POA: Diagnosis not present

## 2017-12-10 DIAGNOSIS — E876 Hypokalemia: Secondary | ICD-10-CM | POA: Diagnosis present

## 2017-12-10 DIAGNOSIS — R1110 Vomiting, unspecified: Secondary | ICD-10-CM | POA: Diagnosis not present

## 2017-12-10 DIAGNOSIS — R1310 Dysphagia, unspecified: Secondary | ICD-10-CM | POA: Diagnosis present

## 2017-12-10 DIAGNOSIS — D72829 Elevated white blood cell count, unspecified: Secondary | ICD-10-CM | POA: Diagnosis not present

## 2017-12-10 DIAGNOSIS — Z79899 Other long term (current) drug therapy: Secondary | ICD-10-CM | POA: Diagnosis not present

## 2017-12-10 DIAGNOSIS — I5032 Chronic diastolic (congestive) heart failure: Secondary | ICD-10-CM | POA: Diagnosis not present

## 2017-12-10 DIAGNOSIS — J90 Pleural effusion, not elsewhere classified: Secondary | ICD-10-CM | POA: Diagnosis not present

## 2017-12-10 DIAGNOSIS — R600 Localized edema: Secondary | ICD-10-CM | POA: Diagnosis not present

## 2017-12-10 DIAGNOSIS — R0789 Other chest pain: Secondary | ICD-10-CM | POA: Diagnosis not present

## 2017-12-10 DIAGNOSIS — I499 Cardiac arrhythmia, unspecified: Secondary | ICD-10-CM | POA: Diagnosis not present

## 2017-12-10 DIAGNOSIS — I1310 Hypertensive heart and chronic kidney disease without heart failure, with stage 1 through stage 4 chronic kidney disease, or unspecified chronic kidney disease: Secondary | ICD-10-CM | POA: Diagnosis not present

## 2017-12-10 DIAGNOSIS — Z8719 Personal history of other diseases of the digestive system: Secondary | ICD-10-CM | POA: Diagnosis not present

## 2017-12-10 DIAGNOSIS — Z7902 Long term (current) use of antithrombotics/antiplatelets: Secondary | ICD-10-CM | POA: Diagnosis not present

## 2017-12-10 DIAGNOSIS — R262 Difficulty in walking, not elsewhere classified: Secondary | ICD-10-CM | POA: Diagnosis not present

## 2017-12-10 DIAGNOSIS — R079 Chest pain, unspecified: Secondary | ICD-10-CM | POA: Diagnosis not present

## 2017-12-10 DIAGNOSIS — J9601 Acute respiratory failure with hypoxia: Secondary | ICD-10-CM | POA: Diagnosis not present

## 2017-12-10 DIAGNOSIS — I4891 Unspecified atrial fibrillation: Secondary | ICD-10-CM | POA: Diagnosis not present

## 2017-12-10 DIAGNOSIS — D649 Anemia, unspecified: Secondary | ICD-10-CM | POA: Diagnosis present

## 2017-12-10 DIAGNOSIS — K219 Gastro-esophageal reflux disease without esophagitis: Secondary | ICD-10-CM | POA: Diagnosis present

## 2017-12-10 DIAGNOSIS — R0602 Shortness of breath: Secondary | ICD-10-CM | POA: Diagnosis not present

## 2017-12-10 DIAGNOSIS — Z8673 Personal history of transient ischemic attack (TIA), and cerebral infarction without residual deficits: Secondary | ICD-10-CM | POA: Diagnosis not present

## 2017-12-10 DIAGNOSIS — Z66 Do not resuscitate: Secondary | ICD-10-CM | POA: Diagnosis present

## 2017-12-10 DIAGNOSIS — K5792 Diverticulitis of intestine, part unspecified, without perforation or abscess without bleeding: Secondary | ICD-10-CM | POA: Diagnosis not present

## 2017-12-10 DIAGNOSIS — I110 Hypertensive heart disease with heart failure: Secondary | ICD-10-CM | POA: Diagnosis not present

## 2017-12-10 DIAGNOSIS — Z9049 Acquired absence of other specified parts of digestive tract: Secondary | ICD-10-CM | POA: Diagnosis not present

## 2017-12-10 DIAGNOSIS — I639 Cerebral infarction, unspecified: Secondary | ICD-10-CM | POA: Diagnosis not present

## 2017-12-10 DIAGNOSIS — G8918 Other acute postprocedural pain: Secondary | ICD-10-CM | POA: Diagnosis not present

## 2017-12-10 DIAGNOSIS — I5033 Acute on chronic diastolic (congestive) heart failure: Secondary | ICD-10-CM | POA: Diagnosis present

## 2017-12-10 DIAGNOSIS — R911 Solitary pulmonary nodule: Secondary | ICD-10-CM | POA: Diagnosis present

## 2017-12-10 DIAGNOSIS — Z933 Colostomy status: Secondary | ICD-10-CM | POA: Diagnosis not present

## 2017-12-14 DIAGNOSIS — R262 Difficulty in walking, not elsewhere classified: Secondary | ICD-10-CM | POA: Diagnosis not present

## 2017-12-14 DIAGNOSIS — I639 Cerebral infarction, unspecified: Secondary | ICD-10-CM | POA: Diagnosis not present

## 2017-12-14 DIAGNOSIS — K5792 Diverticulitis of intestine, part unspecified, without perforation or abscess without bleeding: Secondary | ICD-10-CM | POA: Diagnosis not present

## 2017-12-14 DIAGNOSIS — I1310 Hypertensive heart and chronic kidney disease without heart failure, with stage 1 through stage 4 chronic kidney disease, or unspecified chronic kidney disease: Secondary | ICD-10-CM | POA: Diagnosis not present

## 2017-12-14 DIAGNOSIS — I4891 Unspecified atrial fibrillation: Secondary | ICD-10-CM | POA: Diagnosis not present

## 2017-12-14 DIAGNOSIS — I5032 Chronic diastolic (congestive) heart failure: Secondary | ICD-10-CM | POA: Diagnosis not present

## 2017-12-14 DIAGNOSIS — K219 Gastro-esophageal reflux disease without esophagitis: Secondary | ICD-10-CM | POA: Diagnosis not present

## 2017-12-14 DIAGNOSIS — G8918 Other acute postprocedural pain: Secondary | ICD-10-CM | POA: Diagnosis not present

## 2017-12-15 DIAGNOSIS — J9601 Acute respiratory failure with hypoxia: Secondary | ICD-10-CM | POA: Diagnosis not present

## 2017-12-15 DIAGNOSIS — R531 Weakness: Secondary | ICD-10-CM | POA: Diagnosis not present

## 2017-12-15 DIAGNOSIS — M2550 Pain in unspecified joint: Secondary | ICD-10-CM | POA: Diagnosis not present

## 2017-12-15 DIAGNOSIS — E876 Hypokalemia: Secondary | ICD-10-CM | POA: Diagnosis present

## 2017-12-15 DIAGNOSIS — D72829 Elevated white blood cell count, unspecified: Secondary | ICD-10-CM | POA: Diagnosis not present

## 2017-12-15 DIAGNOSIS — R079 Chest pain, unspecified: Secondary | ICD-10-CM | POA: Diagnosis not present

## 2017-12-15 DIAGNOSIS — Z8673 Personal history of transient ischemic attack (TIA), and cerebral infarction without residual deficits: Secondary | ICD-10-CM | POA: Diagnosis not present

## 2017-12-15 DIAGNOSIS — D649 Anemia, unspecified: Secondary | ICD-10-CM | POA: Diagnosis present

## 2017-12-15 DIAGNOSIS — Z7982 Long term (current) use of aspirin: Secondary | ICD-10-CM | POA: Diagnosis not present

## 2017-12-15 DIAGNOSIS — Z66 Do not resuscitate: Secondary | ICD-10-CM | POA: Diagnosis present

## 2017-12-15 DIAGNOSIS — Z7902 Long term (current) use of antithrombotics/antiplatelets: Secondary | ICD-10-CM | POA: Diagnosis not present

## 2017-12-15 DIAGNOSIS — R0602 Shortness of breath: Secondary | ICD-10-CM | POA: Diagnosis not present

## 2017-12-15 DIAGNOSIS — E873 Alkalosis: Secondary | ICD-10-CM | POA: Diagnosis not present

## 2017-12-15 DIAGNOSIS — K219 Gastro-esophageal reflux disease without esophagitis: Secondary | ICD-10-CM | POA: Diagnosis present

## 2017-12-15 DIAGNOSIS — R1110 Vomiting, unspecified: Secondary | ICD-10-CM | POA: Diagnosis not present

## 2017-12-15 DIAGNOSIS — I110 Hypertensive heart disease with heart failure: Secondary | ICD-10-CM | POA: Diagnosis not present

## 2017-12-15 DIAGNOSIS — Z8719 Personal history of other diseases of the digestive system: Secondary | ICD-10-CM | POA: Diagnosis not present

## 2017-12-15 DIAGNOSIS — K5792 Diverticulitis of intestine, part unspecified, without perforation or abscess without bleeding: Secondary | ICD-10-CM | POA: Diagnosis not present

## 2017-12-15 DIAGNOSIS — E872 Acidosis: Secondary | ICD-10-CM | POA: Diagnosis not present

## 2017-12-15 DIAGNOSIS — I5033 Acute on chronic diastolic (congestive) heart failure: Secondary | ICD-10-CM | POA: Diagnosis present

## 2017-12-15 DIAGNOSIS — R0789 Other chest pain: Secondary | ICD-10-CM | POA: Diagnosis not present

## 2017-12-15 DIAGNOSIS — I4891 Unspecified atrial fibrillation: Secondary | ICD-10-CM | POA: Diagnosis not present

## 2017-12-15 DIAGNOSIS — Z79899 Other long term (current) drug therapy: Secondary | ICD-10-CM | POA: Diagnosis not present

## 2017-12-15 DIAGNOSIS — R911 Solitary pulmonary nodule: Secondary | ICD-10-CM | POA: Diagnosis present

## 2017-12-15 DIAGNOSIS — I499 Cardiac arrhythmia, unspecified: Secondary | ICD-10-CM | POA: Diagnosis not present

## 2017-12-15 DIAGNOSIS — Z933 Colostomy status: Secondary | ICD-10-CM | POA: Diagnosis not present

## 2017-12-15 DIAGNOSIS — Z9049 Acquired absence of other specified parts of digestive tract: Secondary | ICD-10-CM | POA: Diagnosis not present

## 2017-12-15 DIAGNOSIS — J90 Pleural effusion, not elsewhere classified: Secondary | ICD-10-CM | POA: Diagnosis not present

## 2017-12-15 DIAGNOSIS — Z7401 Bed confinement status: Secondary | ICD-10-CM | POA: Diagnosis not present

## 2017-12-15 DIAGNOSIS — R600 Localized edema: Secondary | ICD-10-CM | POA: Diagnosis not present

## 2017-12-15 DIAGNOSIS — R1310 Dysphagia, unspecified: Secondary | ICD-10-CM | POA: Diagnosis present

## 2017-12-15 DIAGNOSIS — I10 Essential (primary) hypertension: Secondary | ICD-10-CM | POA: Diagnosis not present

## 2017-12-19 DIAGNOSIS — Z9049 Acquired absence of other specified parts of digestive tract: Secondary | ICD-10-CM | POA: Diagnosis not present

## 2017-12-19 DIAGNOSIS — I110 Hypertensive heart disease with heart failure: Secondary | ICD-10-CM | POA: Diagnosis not present

## 2017-12-19 DIAGNOSIS — J90 Pleural effusion, not elsewhere classified: Secondary | ICD-10-CM | POA: Diagnosis not present

## 2017-12-19 DIAGNOSIS — R531 Weakness: Secondary | ICD-10-CM | POA: Diagnosis not present

## 2017-12-19 DIAGNOSIS — E873 Alkalosis: Secondary | ICD-10-CM | POA: Diagnosis not present

## 2017-12-19 DIAGNOSIS — M2550 Pain in unspecified joint: Secondary | ICD-10-CM | POA: Diagnosis not present

## 2017-12-19 DIAGNOSIS — I5032 Chronic diastolic (congestive) heart failure: Secondary | ICD-10-CM | POA: Diagnosis not present

## 2017-12-19 DIAGNOSIS — I4891 Unspecified atrial fibrillation: Secondary | ICD-10-CM | POA: Diagnosis not present

## 2017-12-19 DIAGNOSIS — I1310 Hypertensive heart and chronic kidney disease without heart failure, with stage 1 through stage 4 chronic kidney disease, or unspecified chronic kidney disease: Secondary | ICD-10-CM | POA: Diagnosis not present

## 2017-12-19 DIAGNOSIS — G8918 Other acute postprocedural pain: Secondary | ICD-10-CM | POA: Diagnosis not present

## 2017-12-19 DIAGNOSIS — Z8673 Personal history of transient ischemic attack (TIA), and cerebral infarction without residual deficits: Secondary | ICD-10-CM | POA: Diagnosis not present

## 2017-12-19 DIAGNOSIS — R911 Solitary pulmonary nodule: Secondary | ICD-10-CM | POA: Diagnosis not present

## 2017-12-19 DIAGNOSIS — R1310 Dysphagia, unspecified: Secondary | ICD-10-CM | POA: Diagnosis not present

## 2017-12-19 DIAGNOSIS — D72829 Elevated white blood cell count, unspecified: Secondary | ICD-10-CM | POA: Diagnosis not present

## 2017-12-19 DIAGNOSIS — Z933 Colostomy status: Secondary | ICD-10-CM | POA: Diagnosis not present

## 2017-12-19 DIAGNOSIS — R262 Difficulty in walking, not elsewhere classified: Secondary | ICD-10-CM | POA: Diagnosis not present

## 2017-12-19 DIAGNOSIS — J9601 Acute respiratory failure with hypoxia: Secondary | ICD-10-CM | POA: Diagnosis not present

## 2017-12-19 DIAGNOSIS — E876 Hypokalemia: Secondary | ICD-10-CM | POA: Diagnosis not present

## 2017-12-19 DIAGNOSIS — E872 Acidosis: Secondary | ICD-10-CM | POA: Diagnosis not present

## 2017-12-19 DIAGNOSIS — R0789 Other chest pain: Secondary | ICD-10-CM | POA: Diagnosis not present

## 2017-12-19 DIAGNOSIS — D649 Anemia, unspecified: Secondary | ICD-10-CM | POA: Diagnosis not present

## 2017-12-19 DIAGNOSIS — Z7401 Bed confinement status: Secondary | ICD-10-CM | POA: Diagnosis not present

## 2017-12-19 DIAGNOSIS — R1110 Vomiting, unspecified: Secondary | ICD-10-CM | POA: Diagnosis not present

## 2017-12-19 DIAGNOSIS — I639 Cerebral infarction, unspecified: Secondary | ICD-10-CM | POA: Diagnosis not present

## 2017-12-19 DIAGNOSIS — R0602 Shortness of breath: Secondary | ICD-10-CM | POA: Diagnosis not present

## 2017-12-19 DIAGNOSIS — I10 Essential (primary) hypertension: Secondary | ICD-10-CM | POA: Diagnosis not present

## 2017-12-19 DIAGNOSIS — K5792 Diverticulitis of intestine, part unspecified, without perforation or abscess without bleeding: Secondary | ICD-10-CM | POA: Diagnosis not present

## 2017-12-19 DIAGNOSIS — K219 Gastro-esophageal reflux disease without esophagitis: Secondary | ICD-10-CM | POA: Diagnosis not present

## 2017-12-19 DIAGNOSIS — I481 Persistent atrial fibrillation: Secondary | ICD-10-CM | POA: Diagnosis not present

## 2017-12-24 DIAGNOSIS — I1310 Hypertensive heart and chronic kidney disease without heart failure, with stage 1 through stage 4 chronic kidney disease, or unspecified chronic kidney disease: Secondary | ICD-10-CM | POA: Diagnosis not present

## 2017-12-24 DIAGNOSIS — I639 Cerebral infarction, unspecified: Secondary | ICD-10-CM | POA: Diagnosis not present

## 2017-12-24 DIAGNOSIS — K5792 Diverticulitis of intestine, part unspecified, without perforation or abscess without bleeding: Secondary | ICD-10-CM | POA: Diagnosis not present

## 2017-12-24 DIAGNOSIS — K219 Gastro-esophageal reflux disease without esophagitis: Secondary | ICD-10-CM | POA: Diagnosis not present

## 2017-12-24 DIAGNOSIS — I5032 Chronic diastolic (congestive) heart failure: Secondary | ICD-10-CM | POA: Diagnosis not present

## 2017-12-24 DIAGNOSIS — G8918 Other acute postprocedural pain: Secondary | ICD-10-CM | POA: Diagnosis not present

## 2017-12-24 DIAGNOSIS — R262 Difficulty in walking, not elsewhere classified: Secondary | ICD-10-CM | POA: Diagnosis not present

## 2017-12-24 DIAGNOSIS — I4891 Unspecified atrial fibrillation: Secondary | ICD-10-CM | POA: Diagnosis not present

## 2017-12-28 ENCOUNTER — Telehealth: Payer: Self-pay | Admitting: Gastroenterology

## 2017-12-31 NOTE — Telephone Encounter (Signed)
Do we have notes?

## 2018-01-01 NOTE — Telephone Encounter (Signed)
I sent the records to be forwarded to Dr. Lyndel Safe on 12-28-17

## 2018-01-01 NOTE — Telephone Encounter (Signed)
Hi Anna Harding Can you please figure out with the notes are Thanks

## 2018-01-01 NOTE — Telephone Encounter (Signed)
I do not have notes on this patient.

## 2018-01-03 DIAGNOSIS — I4819 Other persistent atrial fibrillation: Secondary | ICD-10-CM | POA: Insufficient documentation

## 2018-01-03 DIAGNOSIS — I481 Persistent atrial fibrillation: Secondary | ICD-10-CM | POA: Diagnosis not present

## 2018-01-10 DIAGNOSIS — Z79891 Long term (current) use of opiate analgesic: Secondary | ICD-10-CM | POA: Diagnosis not present

## 2018-01-10 DIAGNOSIS — I872 Venous insufficiency (chronic) (peripheral): Secondary | ICD-10-CM | POA: Diagnosis not present

## 2018-01-10 DIAGNOSIS — Z48815 Encounter for surgical aftercare following surgery on the digestive system: Secondary | ICD-10-CM | POA: Diagnosis not present

## 2018-01-10 DIAGNOSIS — Z9049 Acquired absence of other specified parts of digestive tract: Secondary | ICD-10-CM | POA: Diagnosis not present

## 2018-01-10 DIAGNOSIS — F419 Anxiety disorder, unspecified: Secondary | ICD-10-CM | POA: Diagnosis not present

## 2018-01-10 DIAGNOSIS — R1310 Dysphagia, unspecified: Secondary | ICD-10-CM | POA: Diagnosis not present

## 2018-01-10 DIAGNOSIS — I272 Pulmonary hypertension, unspecified: Secondary | ICD-10-CM | POA: Diagnosis not present

## 2018-01-10 DIAGNOSIS — I5033 Acute on chronic diastolic (congestive) heart failure: Secondary | ICD-10-CM | POA: Diagnosis not present

## 2018-01-10 DIAGNOSIS — Z9181 History of falling: Secondary | ICD-10-CM | POA: Diagnosis not present

## 2018-01-10 DIAGNOSIS — Z433 Encounter for attention to colostomy: Secondary | ICD-10-CM | POA: Diagnosis not present

## 2018-01-10 DIAGNOSIS — M1711 Unilateral primary osteoarthritis, right knee: Secondary | ICD-10-CM | POA: Diagnosis not present

## 2018-01-10 DIAGNOSIS — Z90710 Acquired absence of both cervix and uterus: Secondary | ICD-10-CM | POA: Diagnosis not present

## 2018-01-10 DIAGNOSIS — Z7901 Long term (current) use of anticoagulants: Secondary | ICD-10-CM | POA: Diagnosis not present

## 2018-01-10 DIAGNOSIS — Z8673 Personal history of transient ischemic attack (TIA), and cerebral infarction without residual deficits: Secondary | ICD-10-CM | POA: Diagnosis not present

## 2018-01-10 DIAGNOSIS — I110 Hypertensive heart disease with heart failure: Secondary | ICD-10-CM | POA: Diagnosis not present

## 2018-01-11 ENCOUNTER — Encounter: Payer: Self-pay | Admitting: Gastroenterology

## 2018-01-11 ENCOUNTER — Ambulatory Visit: Payer: Medicare Other | Admitting: Gastroenterology

## 2018-01-11 DIAGNOSIS — I110 Hypertensive heart disease with heart failure: Secondary | ICD-10-CM | POA: Diagnosis not present

## 2018-01-11 DIAGNOSIS — R1310 Dysphagia, unspecified: Secondary | ICD-10-CM | POA: Diagnosis not present

## 2018-01-11 DIAGNOSIS — I5033 Acute on chronic diastolic (congestive) heart failure: Secondary | ICD-10-CM | POA: Diagnosis not present

## 2018-01-11 DIAGNOSIS — Z48815 Encounter for surgical aftercare following surgery on the digestive system: Secondary | ICD-10-CM | POA: Diagnosis not present

## 2018-01-11 DIAGNOSIS — I872 Venous insufficiency (chronic) (peripheral): Secondary | ICD-10-CM | POA: Diagnosis not present

## 2018-01-11 DIAGNOSIS — F419 Anxiety disorder, unspecified: Secondary | ICD-10-CM | POA: Diagnosis not present

## 2018-01-14 DIAGNOSIS — Z48815 Encounter for surgical aftercare following surgery on the digestive system: Secondary | ICD-10-CM | POA: Diagnosis not present

## 2018-01-14 DIAGNOSIS — I872 Venous insufficiency (chronic) (peripheral): Secondary | ICD-10-CM | POA: Diagnosis not present

## 2018-01-14 DIAGNOSIS — R1310 Dysphagia, unspecified: Secondary | ICD-10-CM | POA: Diagnosis not present

## 2018-01-14 DIAGNOSIS — I5033 Acute on chronic diastolic (congestive) heart failure: Secondary | ICD-10-CM | POA: Diagnosis not present

## 2018-01-14 DIAGNOSIS — I110 Hypertensive heart disease with heart failure: Secondary | ICD-10-CM | POA: Diagnosis not present

## 2018-01-14 DIAGNOSIS — F419 Anxiety disorder, unspecified: Secondary | ICD-10-CM | POA: Diagnosis not present

## 2018-01-15 DIAGNOSIS — Z48815 Encounter for surgical aftercare following surgery on the digestive system: Secondary | ICD-10-CM | POA: Diagnosis not present

## 2018-01-15 DIAGNOSIS — R1310 Dysphagia, unspecified: Secondary | ICD-10-CM | POA: Diagnosis not present

## 2018-01-15 DIAGNOSIS — F419 Anxiety disorder, unspecified: Secondary | ICD-10-CM | POA: Diagnosis not present

## 2018-01-15 DIAGNOSIS — I5033 Acute on chronic diastolic (congestive) heart failure: Secondary | ICD-10-CM | POA: Diagnosis not present

## 2018-01-15 DIAGNOSIS — I110 Hypertensive heart disease with heart failure: Secondary | ICD-10-CM | POA: Diagnosis not present

## 2018-01-15 DIAGNOSIS — I872 Venous insufficiency (chronic) (peripheral): Secondary | ICD-10-CM | POA: Diagnosis not present

## 2018-01-18 DIAGNOSIS — I110 Hypertensive heart disease with heart failure: Secondary | ICD-10-CM | POA: Diagnosis not present

## 2018-01-18 DIAGNOSIS — I5033 Acute on chronic diastolic (congestive) heart failure: Secondary | ICD-10-CM | POA: Diagnosis not present

## 2018-01-18 DIAGNOSIS — R1310 Dysphagia, unspecified: Secondary | ICD-10-CM | POA: Diagnosis not present

## 2018-01-18 DIAGNOSIS — I872 Venous insufficiency (chronic) (peripheral): Secondary | ICD-10-CM | POA: Diagnosis not present

## 2018-01-18 DIAGNOSIS — F419 Anxiety disorder, unspecified: Secondary | ICD-10-CM | POA: Diagnosis not present

## 2018-01-18 DIAGNOSIS — Z48815 Encounter for surgical aftercare following surgery on the digestive system: Secondary | ICD-10-CM | POA: Diagnosis not present

## 2018-01-21 DIAGNOSIS — I872 Venous insufficiency (chronic) (peripheral): Secondary | ICD-10-CM | POA: Diagnosis not present

## 2018-01-21 DIAGNOSIS — Z48815 Encounter for surgical aftercare following surgery on the digestive system: Secondary | ICD-10-CM | POA: Diagnosis not present

## 2018-01-21 DIAGNOSIS — F419 Anxiety disorder, unspecified: Secondary | ICD-10-CM | POA: Diagnosis not present

## 2018-01-21 DIAGNOSIS — I5033 Acute on chronic diastolic (congestive) heart failure: Secondary | ICD-10-CM | POA: Diagnosis not present

## 2018-01-21 DIAGNOSIS — R1310 Dysphagia, unspecified: Secondary | ICD-10-CM | POA: Diagnosis not present

## 2018-01-21 DIAGNOSIS — I110 Hypertensive heart disease with heart failure: Secondary | ICD-10-CM | POA: Diagnosis not present

## 2018-01-22 DIAGNOSIS — I872 Venous insufficiency (chronic) (peripheral): Secondary | ICD-10-CM | POA: Diagnosis not present

## 2018-01-22 DIAGNOSIS — Z48815 Encounter for surgical aftercare following surgery on the digestive system: Secondary | ICD-10-CM | POA: Diagnosis not present

## 2018-01-22 DIAGNOSIS — I110 Hypertensive heart disease with heart failure: Secondary | ICD-10-CM | POA: Diagnosis not present

## 2018-01-22 DIAGNOSIS — I482 Chronic atrial fibrillation, unspecified: Secondary | ICD-10-CM | POA: Diagnosis not present

## 2018-01-22 DIAGNOSIS — Z433 Encounter for attention to colostomy: Secondary | ICD-10-CM | POA: Diagnosis not present

## 2018-01-22 DIAGNOSIS — F419 Anxiety disorder, unspecified: Secondary | ICD-10-CM | POA: Diagnosis not present

## 2018-01-22 DIAGNOSIS — R1310 Dysphagia, unspecified: Secondary | ICD-10-CM | POA: Diagnosis not present

## 2018-01-22 DIAGNOSIS — I10 Essential (primary) hypertension: Secondary | ICD-10-CM | POA: Diagnosis not present

## 2018-01-22 DIAGNOSIS — I5033 Acute on chronic diastolic (congestive) heart failure: Secondary | ICD-10-CM | POA: Diagnosis not present

## 2018-01-23 DIAGNOSIS — R1310 Dysphagia, unspecified: Secondary | ICD-10-CM | POA: Diagnosis not present

## 2018-01-23 DIAGNOSIS — I872 Venous insufficiency (chronic) (peripheral): Secondary | ICD-10-CM | POA: Diagnosis not present

## 2018-01-23 DIAGNOSIS — Z48815 Encounter for surgical aftercare following surgery on the digestive system: Secondary | ICD-10-CM | POA: Diagnosis not present

## 2018-01-23 DIAGNOSIS — F419 Anxiety disorder, unspecified: Secondary | ICD-10-CM | POA: Diagnosis not present

## 2018-01-23 DIAGNOSIS — I110 Hypertensive heart disease with heart failure: Secondary | ICD-10-CM | POA: Diagnosis not present

## 2018-01-23 DIAGNOSIS — I5033 Acute on chronic diastolic (congestive) heart failure: Secondary | ICD-10-CM | POA: Diagnosis not present

## 2018-01-24 DIAGNOSIS — F419 Anxiety disorder, unspecified: Secondary | ICD-10-CM | POA: Diagnosis not present

## 2018-01-24 DIAGNOSIS — Z48815 Encounter for surgical aftercare following surgery on the digestive system: Secondary | ICD-10-CM | POA: Diagnosis not present

## 2018-01-24 DIAGNOSIS — I872 Venous insufficiency (chronic) (peripheral): Secondary | ICD-10-CM | POA: Diagnosis not present

## 2018-01-24 DIAGNOSIS — R1310 Dysphagia, unspecified: Secondary | ICD-10-CM | POA: Diagnosis not present

## 2018-01-24 DIAGNOSIS — I5033 Acute on chronic diastolic (congestive) heart failure: Secondary | ICD-10-CM | POA: Diagnosis not present

## 2018-01-24 DIAGNOSIS — I110 Hypertensive heart disease with heart failure: Secondary | ICD-10-CM | POA: Diagnosis not present

## 2018-01-28 DIAGNOSIS — I872 Venous insufficiency (chronic) (peripheral): Secondary | ICD-10-CM | POA: Diagnosis not present

## 2018-01-28 DIAGNOSIS — Z48815 Encounter for surgical aftercare following surgery on the digestive system: Secondary | ICD-10-CM | POA: Diagnosis not present

## 2018-01-28 DIAGNOSIS — F419 Anxiety disorder, unspecified: Secondary | ICD-10-CM | POA: Diagnosis not present

## 2018-01-28 DIAGNOSIS — I110 Hypertensive heart disease with heart failure: Secondary | ICD-10-CM | POA: Diagnosis not present

## 2018-01-28 DIAGNOSIS — R1310 Dysphagia, unspecified: Secondary | ICD-10-CM | POA: Diagnosis not present

## 2018-01-28 DIAGNOSIS — I5033 Acute on chronic diastolic (congestive) heart failure: Secondary | ICD-10-CM | POA: Diagnosis not present

## 2018-01-29 DIAGNOSIS — Z48815 Encounter for surgical aftercare following surgery on the digestive system: Secondary | ICD-10-CM | POA: Diagnosis not present

## 2018-01-29 DIAGNOSIS — I5033 Acute on chronic diastolic (congestive) heart failure: Secondary | ICD-10-CM | POA: Diagnosis not present

## 2018-01-29 DIAGNOSIS — F419 Anxiety disorder, unspecified: Secondary | ICD-10-CM | POA: Diagnosis not present

## 2018-01-29 DIAGNOSIS — I110 Hypertensive heart disease with heart failure: Secondary | ICD-10-CM | POA: Diagnosis not present

## 2018-01-29 DIAGNOSIS — I872 Venous insufficiency (chronic) (peripheral): Secondary | ICD-10-CM | POA: Diagnosis not present

## 2018-01-29 DIAGNOSIS — R1310 Dysphagia, unspecified: Secondary | ICD-10-CM | POA: Diagnosis not present

## 2018-01-31 DIAGNOSIS — Z48815 Encounter for surgical aftercare following surgery on the digestive system: Secondary | ICD-10-CM | POA: Diagnosis not present

## 2018-01-31 DIAGNOSIS — F419 Anxiety disorder, unspecified: Secondary | ICD-10-CM | POA: Diagnosis not present

## 2018-01-31 DIAGNOSIS — R1310 Dysphagia, unspecified: Secondary | ICD-10-CM | POA: Diagnosis not present

## 2018-01-31 DIAGNOSIS — I5033 Acute on chronic diastolic (congestive) heart failure: Secondary | ICD-10-CM | POA: Diagnosis not present

## 2018-01-31 DIAGNOSIS — I110 Hypertensive heart disease with heart failure: Secondary | ICD-10-CM | POA: Diagnosis not present

## 2018-01-31 DIAGNOSIS — I872 Venous insufficiency (chronic) (peripheral): Secondary | ICD-10-CM | POA: Diagnosis not present

## 2018-02-04 DIAGNOSIS — R1310 Dysphagia, unspecified: Secondary | ICD-10-CM | POA: Diagnosis not present

## 2018-02-04 DIAGNOSIS — I5033 Acute on chronic diastolic (congestive) heart failure: Secondary | ICD-10-CM | POA: Diagnosis not present

## 2018-02-04 DIAGNOSIS — F419 Anxiety disorder, unspecified: Secondary | ICD-10-CM | POA: Diagnosis not present

## 2018-02-04 DIAGNOSIS — I872 Venous insufficiency (chronic) (peripheral): Secondary | ICD-10-CM | POA: Diagnosis not present

## 2018-02-04 DIAGNOSIS — Z48815 Encounter for surgical aftercare following surgery on the digestive system: Secondary | ICD-10-CM | POA: Diagnosis not present

## 2018-02-04 DIAGNOSIS — I110 Hypertensive heart disease with heart failure: Secondary | ICD-10-CM | POA: Diagnosis not present

## 2018-02-04 NOTE — Telephone Encounter (Signed)
Pt No showed on 01-11-18 w/Dr. Lyndel Safe

## 2018-02-05 DIAGNOSIS — R1310 Dysphagia, unspecified: Secondary | ICD-10-CM | POA: Diagnosis not present

## 2018-02-05 DIAGNOSIS — Z48815 Encounter for surgical aftercare following surgery on the digestive system: Secondary | ICD-10-CM | POA: Diagnosis not present

## 2018-02-05 DIAGNOSIS — I872 Venous insufficiency (chronic) (peripheral): Secondary | ICD-10-CM | POA: Diagnosis not present

## 2018-02-05 DIAGNOSIS — I5033 Acute on chronic diastolic (congestive) heart failure: Secondary | ICD-10-CM | POA: Diagnosis not present

## 2018-02-05 DIAGNOSIS — I110 Hypertensive heart disease with heart failure: Secondary | ICD-10-CM | POA: Diagnosis not present

## 2018-02-05 DIAGNOSIS — F419 Anxiety disorder, unspecified: Secondary | ICD-10-CM | POA: Diagnosis not present

## 2018-02-07 DIAGNOSIS — F419 Anxiety disorder, unspecified: Secondary | ICD-10-CM | POA: Diagnosis not present

## 2018-02-07 DIAGNOSIS — I5033 Acute on chronic diastolic (congestive) heart failure: Secondary | ICD-10-CM | POA: Diagnosis not present

## 2018-02-07 DIAGNOSIS — Z48815 Encounter for surgical aftercare following surgery on the digestive system: Secondary | ICD-10-CM | POA: Diagnosis not present

## 2018-02-07 DIAGNOSIS — I872 Venous insufficiency (chronic) (peripheral): Secondary | ICD-10-CM | POA: Diagnosis not present

## 2018-02-07 DIAGNOSIS — I110 Hypertensive heart disease with heart failure: Secondary | ICD-10-CM | POA: Diagnosis not present

## 2018-02-07 DIAGNOSIS — R1310 Dysphagia, unspecified: Secondary | ICD-10-CM | POA: Diagnosis not present

## 2018-02-11 DIAGNOSIS — I872 Venous insufficiency (chronic) (peripheral): Secondary | ICD-10-CM | POA: Diagnosis not present

## 2018-02-11 DIAGNOSIS — R1310 Dysphagia, unspecified: Secondary | ICD-10-CM | POA: Diagnosis not present

## 2018-02-11 DIAGNOSIS — I5033 Acute on chronic diastolic (congestive) heart failure: Secondary | ICD-10-CM | POA: Diagnosis not present

## 2018-02-11 DIAGNOSIS — I110 Hypertensive heart disease with heart failure: Secondary | ICD-10-CM | POA: Diagnosis not present

## 2018-02-11 DIAGNOSIS — F419 Anxiety disorder, unspecified: Secondary | ICD-10-CM | POA: Diagnosis not present

## 2018-02-11 DIAGNOSIS — Z48815 Encounter for surgical aftercare following surgery on the digestive system: Secondary | ICD-10-CM | POA: Diagnosis not present

## 2018-02-13 DIAGNOSIS — R1310 Dysphagia, unspecified: Secondary | ICD-10-CM | POA: Diagnosis not present

## 2018-02-13 DIAGNOSIS — I872 Venous insufficiency (chronic) (peripheral): Secondary | ICD-10-CM | POA: Diagnosis not present

## 2018-02-13 DIAGNOSIS — I5033 Acute on chronic diastolic (congestive) heart failure: Secondary | ICD-10-CM | POA: Diagnosis not present

## 2018-02-13 DIAGNOSIS — F419 Anxiety disorder, unspecified: Secondary | ICD-10-CM | POA: Diagnosis not present

## 2018-02-13 DIAGNOSIS — I110 Hypertensive heart disease with heart failure: Secondary | ICD-10-CM | POA: Diagnosis not present

## 2018-02-13 DIAGNOSIS — Z48815 Encounter for surgical aftercare following surgery on the digestive system: Secondary | ICD-10-CM | POA: Diagnosis not present

## 2018-02-14 DIAGNOSIS — I700 Atherosclerosis of aorta: Secondary | ICD-10-CM | POA: Diagnosis not present

## 2018-02-14 DIAGNOSIS — K449 Diaphragmatic hernia without obstruction or gangrene: Secondary | ICD-10-CM | POA: Diagnosis not present

## 2018-02-14 DIAGNOSIS — Z933 Colostomy status: Secondary | ICD-10-CM | POA: Diagnosis not present

## 2018-02-14 DIAGNOSIS — R109 Unspecified abdominal pain: Secondary | ICD-10-CM | POA: Diagnosis not present

## 2018-02-15 DIAGNOSIS — R634 Abnormal weight loss: Secondary | ICD-10-CM | POA: Diagnosis not present

## 2018-02-15 DIAGNOSIS — I4891 Unspecified atrial fibrillation: Secondary | ICD-10-CM | POA: Diagnosis not present

## 2018-02-15 DIAGNOSIS — I10 Essential (primary) hypertension: Secondary | ICD-10-CM | POA: Diagnosis not present

## 2018-02-15 DIAGNOSIS — R531 Weakness: Secondary | ICD-10-CM | POA: Diagnosis not present

## 2018-02-18 DIAGNOSIS — Z48815 Encounter for surgical aftercare following surgery on the digestive system: Secondary | ICD-10-CM | POA: Diagnosis not present

## 2018-02-18 DIAGNOSIS — R1310 Dysphagia, unspecified: Secondary | ICD-10-CM | POA: Diagnosis not present

## 2018-02-18 DIAGNOSIS — I5033 Acute on chronic diastolic (congestive) heart failure: Secondary | ICD-10-CM | POA: Diagnosis not present

## 2018-02-18 DIAGNOSIS — F419 Anxiety disorder, unspecified: Secondary | ICD-10-CM | POA: Diagnosis not present

## 2018-02-18 DIAGNOSIS — I872 Venous insufficiency (chronic) (peripheral): Secondary | ICD-10-CM | POA: Diagnosis not present

## 2018-02-18 DIAGNOSIS — I110 Hypertensive heart disease with heart failure: Secondary | ICD-10-CM | POA: Diagnosis not present

## 2018-02-20 DIAGNOSIS — F419 Anxiety disorder, unspecified: Secondary | ICD-10-CM | POA: Diagnosis not present

## 2018-02-20 DIAGNOSIS — I110 Hypertensive heart disease with heart failure: Secondary | ICD-10-CM | POA: Diagnosis not present

## 2018-02-20 DIAGNOSIS — Z48815 Encounter for surgical aftercare following surgery on the digestive system: Secondary | ICD-10-CM | POA: Diagnosis not present

## 2018-02-20 DIAGNOSIS — I5033 Acute on chronic diastolic (congestive) heart failure: Secondary | ICD-10-CM | POA: Diagnosis not present

## 2018-02-20 DIAGNOSIS — I872 Venous insufficiency (chronic) (peripheral): Secondary | ICD-10-CM | POA: Diagnosis not present

## 2018-02-20 DIAGNOSIS — R1310 Dysphagia, unspecified: Secondary | ICD-10-CM | POA: Diagnosis not present

## 2018-02-25 DIAGNOSIS — R531 Weakness: Secondary | ICD-10-CM | POA: Diagnosis not present

## 2018-02-25 DIAGNOSIS — R0609 Other forms of dyspnea: Secondary | ICD-10-CM | POA: Diagnosis not present

## 2018-02-25 DIAGNOSIS — Z433 Encounter for attention to colostomy: Secondary | ICD-10-CM | POA: Diagnosis not present

## 2018-02-25 DIAGNOSIS — I4821 Permanent atrial fibrillation: Secondary | ICD-10-CM | POA: Diagnosis not present

## 2018-02-25 DIAGNOSIS — I10 Essential (primary) hypertension: Secondary | ICD-10-CM | POA: Diagnosis not present

## 2018-02-26 DIAGNOSIS — I872 Venous insufficiency (chronic) (peripheral): Secondary | ICD-10-CM | POA: Diagnosis not present

## 2018-02-26 DIAGNOSIS — Z48815 Encounter for surgical aftercare following surgery on the digestive system: Secondary | ICD-10-CM | POA: Diagnosis not present

## 2018-02-26 DIAGNOSIS — F419 Anxiety disorder, unspecified: Secondary | ICD-10-CM | POA: Diagnosis not present

## 2018-02-26 DIAGNOSIS — I5033 Acute on chronic diastolic (congestive) heart failure: Secondary | ICD-10-CM | POA: Diagnosis not present

## 2018-02-26 DIAGNOSIS — R1310 Dysphagia, unspecified: Secondary | ICD-10-CM | POA: Diagnosis not present

## 2018-02-26 DIAGNOSIS — I110 Hypertensive heart disease with heart failure: Secondary | ICD-10-CM | POA: Diagnosis not present

## 2018-03-01 DIAGNOSIS — R1310 Dysphagia, unspecified: Secondary | ICD-10-CM | POA: Diagnosis not present

## 2018-03-01 DIAGNOSIS — I5033 Acute on chronic diastolic (congestive) heart failure: Secondary | ICD-10-CM | POA: Diagnosis not present

## 2018-03-01 DIAGNOSIS — I110 Hypertensive heart disease with heart failure: Secondary | ICD-10-CM | POA: Diagnosis not present

## 2018-03-01 DIAGNOSIS — Z48815 Encounter for surgical aftercare following surgery on the digestive system: Secondary | ICD-10-CM | POA: Diagnosis not present

## 2018-03-01 DIAGNOSIS — F419 Anxiety disorder, unspecified: Secondary | ICD-10-CM | POA: Diagnosis not present

## 2018-03-01 DIAGNOSIS — I872 Venous insufficiency (chronic) (peripheral): Secondary | ICD-10-CM | POA: Diagnosis not present

## 2018-03-04 DIAGNOSIS — I872 Venous insufficiency (chronic) (peripheral): Secondary | ICD-10-CM | POA: Diagnosis not present

## 2018-03-04 DIAGNOSIS — Z48815 Encounter for surgical aftercare following surgery on the digestive system: Secondary | ICD-10-CM | POA: Diagnosis not present

## 2018-03-04 DIAGNOSIS — R1310 Dysphagia, unspecified: Secondary | ICD-10-CM | POA: Diagnosis not present

## 2018-03-04 DIAGNOSIS — I5033 Acute on chronic diastolic (congestive) heart failure: Secondary | ICD-10-CM | POA: Diagnosis not present

## 2018-03-04 DIAGNOSIS — I110 Hypertensive heart disease with heart failure: Secondary | ICD-10-CM | POA: Diagnosis not present

## 2018-03-04 DIAGNOSIS — F419 Anxiety disorder, unspecified: Secondary | ICD-10-CM | POA: Diagnosis not present

## 2018-04-02 DIAGNOSIS — Z7189 Other specified counseling: Secondary | ICD-10-CM | POA: Diagnosis not present

## 2018-04-24 DIAGNOSIS — R1310 Dysphagia, unspecified: Secondary | ICD-10-CM | POA: Diagnosis not present

## 2018-04-24 DIAGNOSIS — L299 Pruritus, unspecified: Secondary | ICD-10-CM | POA: Diagnosis not present

## 2018-04-24 DIAGNOSIS — R0602 Shortness of breath: Secondary | ICD-10-CM | POA: Diagnosis not present

## 2018-04-30 DIAGNOSIS — I10 Essential (primary) hypertension: Secondary | ICD-10-CM | POA: Diagnosis not present

## 2018-04-30 DIAGNOSIS — I482 Chronic atrial fibrillation, unspecified: Secondary | ICD-10-CM | POA: Diagnosis not present

## 2018-04-30 DIAGNOSIS — I5033 Acute on chronic diastolic (congestive) heart failure: Secondary | ICD-10-CM | POA: Diagnosis present

## 2018-04-30 DIAGNOSIS — R609 Edema, unspecified: Secondary | ICD-10-CM | POA: Diagnosis not present

## 2018-04-30 DIAGNOSIS — Z933 Colostomy status: Secondary | ICD-10-CM | POA: Diagnosis not present

## 2018-04-30 DIAGNOSIS — I272 Pulmonary hypertension, unspecified: Secondary | ICD-10-CM | POA: Diagnosis present

## 2018-04-30 DIAGNOSIS — R0609 Other forms of dyspnea: Secondary | ICD-10-CM | POA: Diagnosis not present

## 2018-04-30 DIAGNOSIS — E876 Hypokalemia: Secondary | ICD-10-CM | POA: Diagnosis present

## 2018-04-30 DIAGNOSIS — R5381 Other malaise: Secondary | ICD-10-CM | POA: Diagnosis present

## 2018-04-30 DIAGNOSIS — J90 Pleural effusion, not elsewhere classified: Secondary | ICD-10-CM | POA: Diagnosis not present

## 2018-04-30 DIAGNOSIS — J918 Pleural effusion in other conditions classified elsewhere: Secondary | ICD-10-CM | POA: Diagnosis not present

## 2018-04-30 DIAGNOSIS — K219 Gastro-esophageal reflux disease without esophagitis: Secondary | ICD-10-CM | POA: Diagnosis present

## 2018-04-30 DIAGNOSIS — Z66 Do not resuscitate: Secondary | ICD-10-CM | POA: Diagnosis present

## 2018-04-30 DIAGNOSIS — Z8673 Personal history of transient ischemic attack (TIA), and cerebral infarction without residual deficits: Secondary | ICD-10-CM | POA: Diagnosis not present

## 2018-04-30 DIAGNOSIS — F419 Anxiety disorder, unspecified: Secondary | ICD-10-CM | POA: Diagnosis present

## 2018-04-30 DIAGNOSIS — Z7901 Long term (current) use of anticoagulants: Secondary | ICD-10-CM | POA: Diagnosis not present

## 2018-04-30 DIAGNOSIS — J9601 Acute respiratory failure with hypoxia: Secondary | ICD-10-CM | POA: Diagnosis present

## 2018-04-30 DIAGNOSIS — I4821 Permanent atrial fibrillation: Secondary | ICD-10-CM | POA: Diagnosis not present

## 2018-04-30 DIAGNOSIS — R600 Localized edema: Secondary | ICD-10-CM | POA: Diagnosis not present

## 2018-04-30 DIAGNOSIS — I110 Hypertensive heart disease with heart failure: Secondary | ICD-10-CM | POA: Diagnosis present

## 2018-04-30 DIAGNOSIS — D649 Anemia, unspecified: Secondary | ICD-10-CM | POA: Diagnosis present

## 2018-04-30 DIAGNOSIS — I872 Venous insufficiency (chronic) (peripheral): Secondary | ICD-10-CM | POA: Diagnosis present

## 2018-04-30 DIAGNOSIS — K222 Esophageal obstruction: Secondary | ICD-10-CM | POA: Diagnosis present

## 2018-05-01 DIAGNOSIS — J90 Pleural effusion, not elsewhere classified: Secondary | ICD-10-CM | POA: Diagnosis not present

## 2018-05-01 DIAGNOSIS — J9601 Acute respiratory failure with hypoxia: Secondary | ICD-10-CM | POA: Diagnosis not present

## 2018-05-01 DIAGNOSIS — I10 Essential (primary) hypertension: Secondary | ICD-10-CM | POA: Diagnosis not present

## 2018-05-01 DIAGNOSIS — E876 Hypokalemia: Secondary | ICD-10-CM | POA: Diagnosis not present

## 2018-05-08 DIAGNOSIS — I4821 Permanent atrial fibrillation: Secondary | ICD-10-CM | POA: Diagnosis not present

## 2018-05-08 DIAGNOSIS — N178 Other acute kidney failure: Secondary | ICD-10-CM | POA: Diagnosis not present

## 2018-05-08 DIAGNOSIS — I509 Heart failure, unspecified: Secondary | ICD-10-CM | POA: Diagnosis not present

## 2018-05-15 DIAGNOSIS — I5033 Acute on chronic diastolic (congestive) heart failure: Secondary | ICD-10-CM | POA: Diagnosis not present

## 2018-05-15 DIAGNOSIS — I4821 Permanent atrial fibrillation: Secondary | ICD-10-CM | POA: Diagnosis not present

## 2018-05-21 DIAGNOSIS — R2681 Unsteadiness on feet: Secondary | ICD-10-CM | POA: Diagnosis not present

## 2018-06-04 DIAGNOSIS — R2681 Unsteadiness on feet: Secondary | ICD-10-CM | POA: Diagnosis not present

## 2018-06-14 DIAGNOSIS — I5033 Acute on chronic diastolic (congestive) heart failure: Secondary | ICD-10-CM | POA: Diagnosis not present

## 2018-06-14 DIAGNOSIS — I4821 Permanent atrial fibrillation: Secondary | ICD-10-CM | POA: Diagnosis not present

## 2018-06-14 DIAGNOSIS — R5383 Other fatigue: Secondary | ICD-10-CM | POA: Diagnosis not present

## 2018-06-14 DIAGNOSIS — K5900 Constipation, unspecified: Secondary | ICD-10-CM | POA: Diagnosis not present

## 2018-06-18 DIAGNOSIS — E876 Hypokalemia: Secondary | ICD-10-CM | POA: Diagnosis not present

## 2018-06-19 DIAGNOSIS — I5033 Acute on chronic diastolic (congestive) heart failure: Secondary | ICD-10-CM | POA: Diagnosis not present

## 2018-06-19 DIAGNOSIS — R0602 Shortness of breath: Secondary | ICD-10-CM | POA: Diagnosis not present

## 2018-08-11 ENCOUNTER — Other Ambulatory Visit: Payer: Self-pay

## 2018-08-11 ENCOUNTER — Encounter (HOSPITAL_COMMUNITY): Payer: Self-pay | Admitting: Internal Medicine

## 2018-08-11 ENCOUNTER — Inpatient Hospital Stay (HOSPITAL_COMMUNITY)
Admission: AD | Admit: 2018-08-11 | Discharge: 2018-08-15 | DRG: 395 | Disposition: A | Discharge status: Home / Self Care | Payer: Medicare Other | Source: Other Acute Inpatient Hospital | Attending: Internal Medicine | Admitting: Internal Medicine

## 2018-08-11 DIAGNOSIS — E876 Hypokalemia: Secondary | ICD-10-CM | POA: Diagnosis present

## 2018-08-11 DIAGNOSIS — K433 Parastomal hernia with obstruction, without gangrene: Principal | ICD-10-CM | POA: Diagnosis present

## 2018-08-11 DIAGNOSIS — Z888 Allergy status to other drugs, medicaments and biological substances status: Secondary | ICD-10-CM | POA: Diagnosis not present

## 2018-08-11 DIAGNOSIS — R1084 Generalized abdominal pain: Secondary | ICD-10-CM | POA: Diagnosis not present

## 2018-08-11 DIAGNOSIS — Z90710 Acquired absence of both cervix and uterus: Secondary | ICD-10-CM | POA: Diagnosis not present

## 2018-08-11 DIAGNOSIS — R52 Pain, unspecified: Secondary | ICD-10-CM | POA: Diagnosis not present

## 2018-08-11 DIAGNOSIS — Z881 Allergy status to other antibiotic agents status: Secondary | ICD-10-CM | POA: Diagnosis not present

## 2018-08-11 DIAGNOSIS — I700 Atherosclerosis of aorta: Secondary | ICD-10-CM | POA: Diagnosis present

## 2018-08-11 DIAGNOSIS — Z4682 Encounter for fitting and adjustment of non-vascular catheter: Secondary | ICD-10-CM | POA: Diagnosis not present

## 2018-08-11 DIAGNOSIS — I10 Essential (primary) hypertension: Secondary | ICD-10-CM | POA: Diagnosis present

## 2018-08-11 DIAGNOSIS — R600 Localized edema: Secondary | ICD-10-CM | POA: Diagnosis present

## 2018-08-11 DIAGNOSIS — Z7901 Long term (current) use of anticoagulants: Secondary | ICD-10-CM | POA: Diagnosis not present

## 2018-08-11 DIAGNOSIS — Z933 Colostomy status: Secondary | ICD-10-CM | POA: Diagnosis not present

## 2018-08-11 DIAGNOSIS — G8929 Other chronic pain: Secondary | ICD-10-CM | POA: Diagnosis present

## 2018-08-11 DIAGNOSIS — I480 Paroxysmal atrial fibrillation: Secondary | ICD-10-CM | POA: Diagnosis present

## 2018-08-11 DIAGNOSIS — W19XXXA Unspecified fall, initial encounter: Secondary | ICD-10-CM | POA: Diagnosis not present

## 2018-08-11 DIAGNOSIS — E039 Hypothyroidism, unspecified: Secondary | ICD-10-CM | POA: Diagnosis present

## 2018-08-11 DIAGNOSIS — K56609 Unspecified intestinal obstruction, unspecified as to partial versus complete obstruction: Secondary | ICD-10-CM

## 2018-08-11 DIAGNOSIS — J449 Chronic obstructive pulmonary disease, unspecified: Secondary | ICD-10-CM | POA: Diagnosis not present

## 2018-08-11 DIAGNOSIS — I2510 Atherosclerotic heart disease of native coronary artery without angina pectoris: Secondary | ICD-10-CM | POA: Diagnosis not present

## 2018-08-11 DIAGNOSIS — Z8541 Personal history of malignant neoplasm of cervix uteri: Secondary | ICD-10-CM

## 2018-08-11 DIAGNOSIS — Z823 Family history of stroke: Secondary | ICD-10-CM

## 2018-08-11 DIAGNOSIS — Z885 Allergy status to narcotic agent status: Secondary | ICD-10-CM | POA: Diagnosis not present

## 2018-08-11 DIAGNOSIS — K219 Gastro-esophageal reflux disease without esophagitis: Secondary | ICD-10-CM | POA: Diagnosis present

## 2018-08-11 DIAGNOSIS — Z0189 Encounter for other specified special examinations: Secondary | ICD-10-CM

## 2018-08-11 DIAGNOSIS — Z90722 Acquired absence of ovaries, bilateral: Secondary | ICD-10-CM

## 2018-08-11 DIAGNOSIS — R748 Abnormal levels of other serum enzymes: Secondary | ICD-10-CM | POA: Diagnosis present

## 2018-08-11 DIAGNOSIS — Z8673 Personal history of transient ischemic attack (TIA), and cerebral infarction without residual deficits: Secondary | ICD-10-CM | POA: Diagnosis not present

## 2018-08-11 DIAGNOSIS — Z880 Allergy status to penicillin: Secondary | ICD-10-CM | POA: Diagnosis not present

## 2018-08-11 DIAGNOSIS — R109 Unspecified abdominal pain: Secondary | ICD-10-CM | POA: Diagnosis present

## 2018-08-11 DIAGNOSIS — K3189 Other diseases of stomach and duodenum: Secondary | ICD-10-CM | POA: Diagnosis not present

## 2018-08-11 DIAGNOSIS — K311 Adult hypertrophic pyloric stenosis: Secondary | ICD-10-CM | POA: Diagnosis not present

## 2018-08-11 DIAGNOSIS — Z9049 Acquired absence of other specified parts of digestive tract: Secondary | ICD-10-CM

## 2018-08-11 DIAGNOSIS — Z79899 Other long term (current) drug therapy: Secondary | ICD-10-CM | POA: Diagnosis not present

## 2018-08-11 DIAGNOSIS — D638 Anemia in other chronic diseases classified elsewhere: Secondary | ICD-10-CM | POA: Diagnosis present

## 2018-08-11 DIAGNOSIS — Z882 Allergy status to sulfonamides status: Secondary | ICD-10-CM

## 2018-08-11 DIAGNOSIS — Z91048 Other nonmedicinal substance allergy status: Secondary | ICD-10-CM

## 2018-08-11 DIAGNOSIS — Z91038 Other insect allergy status: Secondary | ICD-10-CM | POA: Diagnosis not present

## 2018-08-11 DIAGNOSIS — K435 Parastomal hernia without obstruction or  gangrene: Secondary | ICD-10-CM | POA: Diagnosis not present

## 2018-08-11 DIAGNOSIS — K469 Unspecified abdominal hernia without obstruction or gangrene: Secondary | ICD-10-CM | POA: Diagnosis not present

## 2018-08-11 HISTORY — DX: Diverticulitis of intestine, part unspecified, without perforation or abscess without bleeding: K57.92

## 2018-08-11 MED ORDER — ACETAMINOPHEN 325 MG PO TABS
650.0000 mg | ORAL_TABLET | Freq: Four times a day (QID) | ORAL | Status: DC | PRN
  Administered 2018-08-15: 12:00:00 650 mg via ORAL
  Filled 2018-08-11: qty 2

## 2018-08-11 MED ORDER — ONDANSETRON HCL 4 MG/2ML IJ SOLN: 4.0000 mg | Freq: Four times a day (QID) | INTRAMUSCULAR | Status: DC | PRN

## 2018-08-11 MED ORDER — HYDROMORPHONE HCL 1 MG/ML IJ SOLN
0.5000 mg | Freq: Four times a day (QID) | INTRAMUSCULAR | Status: DC | PRN
  Administered 2018-08-13: 0.5 mg via INTRAVENOUS
  Filled 2018-08-11: qty 1

## 2018-08-11 MED ORDER — FAMOTIDINE IN NACL 20-0.9 MG/50ML-% IV SOLN
20.0000 mg | Freq: Every day | INTRAVENOUS | Status: DC
  Administered 2018-08-12 – 2018-08-13 (×3): 20 mg via INTRAVENOUS
  Filled 2018-08-11 (×3): qty 50

## 2018-08-11 MED ORDER — DILTIAZEM HCL ER COATED BEADS 180 MG PO CP24
180.0000 mg | ORAL_CAPSULE | Freq: Every day | ORAL | Status: DC
  Administered 2018-08-13 – 2018-08-15 (×3): 180 mg via ORAL
  Filled 2018-08-11 (×4): qty 1

## 2018-08-11 MED ORDER — POTASSIUM CHLORIDE IN NACL 20-0.9 MEQ/L-% IV SOLN
INTRAVENOUS | Status: AC
  Administered 2018-08-12: via INTRAVENOUS
  Filled 2018-08-11: qty 1000

## 2018-08-11 MED ORDER — ACETAMINOPHEN 650 MG RE SUPP: 650.0000 mg | Freq: Four times a day (QID) | RECTAL | Status: DC | PRN

## 2018-08-11 MED ORDER — FUROSEMIDE 10 MG/ML IJ SOLN
20.0000 mg | Freq: Every day | INTRAMUSCULAR | Status: DC
  Administered 2018-08-12 – 2018-08-13 (×2): 20 mg via INTRAVENOUS
  Filled 2018-08-11 (×2): qty 2

## 2018-08-11 MED ORDER — MAGNESIUM SULFATE IN D5W 1-5 GM/100ML-% IV SOLN
1.0000 g | Freq: Once | INTRAVENOUS | Status: AC
  Administered 2018-08-12: 1 g via INTRAVENOUS
  Filled 2018-08-11: qty 100

## 2018-08-11 NOTE — Consult Note (Signed)
Reason for Consult: Large parastomal hernia  referring Physician: Eriyana Harding is an 85 y.o. female.  HPI: [name] underwent colectomy with colostomy for acute diverticulitis last August at [facility].  About 3 months ago she developed a parastomal hernia.  This is gradually gotten larger.  It became very painful over the past 24 hours and she went to [facility] for further evaluation.  CT scan demonstrated large left-sided parastomal hernia containing colon, stomach and a little bit of small bowel.  It was causing a gastric outlet obstruction.  She was accepted in transfer from [facility] emergency room for further surgical care here at [facility].  She was accepted by the hospitalist service.  An NG tube was placed at [facility] and they got up 2-1/2 L.  She says she feels much better.  She denies recent upper respiratory illness.  She does note she takes Eliquis for atrial fibrillation.  Past Medical History:  Diagnosis Date  . A-fib ([place])   . Carcinoma of cervix ([place])   . Diverticulitis 11/29/2017  . GERD (gastroesophageal reflux disease)   . Hiatal hernia   . HTN (hypertension)   . Hypothyroidism       Family History  Problem Relation Age of Onset  . CAD Mother   . Stroke Father     Social History:  reports that she has never smoked. She has never used smokeless tobacco. She reports previous alcohol use. No history on file for drug.  Allergies:  Allergies  Allergen Reactions  . Wasp Venom Anaphylaxis  . Ciprofloxacin   . Codeine Other (See Comments)    Dizziness  . Furosemide Nausea Only  . Penicillins     unknown  . Prednisone     unknown  . Sulfa Antibiotics Hives  . Warfarin And Related     Unknown   . Tape Rash    Adhesive tape    Medications: I have reviewed the patient's current medications.  No results found for this or any previous visit (from the past 48 hour(s)).  No results found.  Review of Systems  Constitutional:  Negative for chills and fever.  HENT: Negative.   Eyes: Negative.   Respiratory: Negative.   Cardiovascular: Negative.   Gastrointestinal: Positive for abdominal pain, nausea and vomiting.  Genitourinary: Negative.   Musculoskeletal: Negative.   Skin: Negative.   Neurological: Negative.   Endo/Heme/Allergies: Bruises/bleeds easily.  Psychiatric/Behavioral: Negative.    There were no vitals taken for this visit. Physical Exam  Constitutional: She is oriented to person, place, and time. She appears well-developed and well-nourished. No distress.  HENT:  Head: Normocephalic.  Nose: Nose normal.  Mouth/Throat: Oropharynx is clear and moist.  Eyes: Pupils are equal, round, and reactive to light.  Neck: Neck supple. No thyromegaly present.  Cardiovascular:  Irregularly irregular  Respiratory: Effort normal and breath sounds normal. No respiratory distress. She has no wheezes.  GI: Soft. She exhibits no distension. There is abdominal tenderness. There is no rebound and no guarding.  Large left-sided parastomal hernia that reduces but immediately recurs, mild tenderness here, no generalized tenderness, stoma is pink, no peritonitis  Musculoskeletal: Normal range of motion.        General: Edema present.  Neurological: She is alert and oriented to person, place, and time.  Skin: Skin is warm.  Psychiatric: She has a normal mood and affect.    Assessment/Plan: Large parastomal hernia containing stomach, colon, and small bowel - she has  improved significantly since placement of the NG tube.  White blood cell count and lactate were okay at [place].  Appreciate hospitalist admission.  We will let her Eliquis wear off and plan repair of this parastomal hernia this admission.  We will follow closely.  I discussed with Dr. Maudie Mercury at the bedside.  Zenovia Jarred 08/11/2018, 10:57 PM

## 2018-08-11 NOTE — H&P (Addendum)
TRH H&P    Patient Demographics:    Anna Harding, is a 85 y.o. female  MRN: [id number]  DOB - Sep 04, 1932  Admit Date - 08/11/2018  Referring MD/NP/PA:  Arlyss Repress   Outpatient Primary MD for the patient is Patient, No Pcp Per GI-  Dr. Lyndel Safe  Patient coming from: [facility] ER  Chief complaint- abdominal pain   HPI:    Anna Harding  is a 85 y.o. female,w Pafib, chronic lower ext edema, h/o diverticulitis s/p sigmoid colon resection w colostomy apparently c/o abdominal pain with eating over the past several weeks and worse this past week. This AM the pain was severe, associated with nausea and prompted her to go to ER.  Her ostomy output has been more liquid or soft recently.  No blood.   Pt denies fever, chills, cough, cp, palp, sob, emesis, dysuria, hematuria.  Pt was brought to [facility] ER ,  Has had recent admission 2 weeks ago for LE edema.   In ED,  AFebrile    Wbc 7.2, hgb 12.1, Plt 256 INR 1.0 Na 139, K 2.8,  Bun 22, Creatinine 0.90 Glucose 148 Trop <0.01 Ast 29, Alt 16 Lipase 1165 Lactic acid 1.7  Urinalysis negative  CXR Impression  Enteric tube placed, tip at level of gastric body Mildly lower lung volumes with patchy increased left basilar opacity favored to be atelectasis  CT abd/ pelvis Impression 1. Left lower quadrant colostomy with large peristomal hernia which contains portions of the stomach, transverse colon and a short segment of small bowel.  At this time, there is massive gastric, distension, which suggests gastric outlet obstruction secondary to this entrapment.  In addition, the entrapped short segment of small bowel demonstrates poor enhancement, which may suggest developing bowel ischemia.   2.  Aortic atherosclerosis  Pt will be admitted for entrapment of bowel, lipase elevation , hypokalemia.      Review of systems:    In addition to the HPI above,  No  Fever-chills, No Headache, No changes with Vision or hearing, No problems swallowing food or Liquids, No Chest pain, Cough or Shortness of Breath,  No Blood in stool or Urine, No dysuria, No new skin rashes or bruises, No new joints pains-aches,  No new weakness, tingling, numbness in any extremity, No recent weight gain or loss, No polyuria, polydypsia or polyphagia, No significant Mental Stressors.  All other systems reviewed and are negative.    Past History of the following :    Past Medical History:  Diagnosis Date  . A-fib ([place])   . Carcinoma of cervix ([place])   . Diverticulitis 11/29/2017  . GERD (gastroesophageal reflux disease)   . Hiatal hernia   . HTN (hypertension)   . Hypothyroidism       Past Surgical History:  Procedure Laterality Date  . BREAST LUMPECTOMY    . COLON RESECTION SIGMOID  11/29/2017  . ESOPHAGOGASTRODUODENOSCOPY  07/09/2014   Tight distal esophageal stricture status post esophageal dilatation to 65mm  TTS balloon. Moderate hiatal hernia.   Marland Kitchen  OOPHORECTOMY    . VAGINAL HYSTERECTOMY    . Vein Varicosity w/ Ulcer        Social History:      Social History   Tobacco Use  . Smoking status: Never Smoker  . Smokeless tobacco: Never Used  Substance Use Topics  . Alcohol use: Not Currently       Family History :     Family History  Problem Relation Age of Onset  . CAD Mother   . Stroke Father        Home Medications:   Prior to Admission medications   Not on File   Medications reviewed, and ordered   Allergies:     Allergies  Allergen Reactions  . Wasp Venom Anaphylaxis  . Ciprofloxacin   . Codeine Other (See Comments)    Dizziness  . Furosemide Nausea Only  . Penicillins     unknown  . Prednisone     unknown  . Sulfa Antibiotics Hives  . Warfarin And Related     Unknown   . Tape Rash    Adhesive tape     Physical Exam:   Vitals  Blood pressure (!) 141/86, pulse 78, temperature 98.3 F (36.8 C),  temperature source Oral, weight 69.8 kg, SpO2 100 %.  1.  General: axox3  2. Psychiatric: euthymic  3. Neurologic: cn2-12 intact, reflexes 2+ symmetric, diffuse with no clonus, motor 5/5 in all 4 ext  4. HEENMT:  Anicteric, pupils 1.8mm symmetric, direct, consensual, near intact, mmm  5. Respiratory : CTAB  6. Cardiovascular : Irr, irr s1, s2, no m/g/r  7. Gastrointestinal:  Abd soft, midline scar, + parastomal hernia, 30mm slight ulcer at 11 oclock, probably cause by irritation from colostomy + LLQ colostomy  8. Skin:  No rash Ext: no c/c,  Trace edema  9.Musculoskeletal:  Good ROM  No adenopathy    Data Review:    CBC No results for input(s): WBC, HGB, HCT, PLT, MCV, MCH, MCHC, RDW, LYMPHSABS, MONOABS, EOSABS, BASOSABS, BANDABS in the last 168 hours.  Invalid input(s): NEUTRABS, BANDSABD ------------------------------------------------------------------------------------------------------------------  No results found for this or any previous visit (from the past 72 hour(s)).  Chemistries  No results for input(s): NA, K, CL, CO2, GLUCOSE, BUN, CREATININE, CALCIUM, MG, AST, ALT, ALKPHOS, BILITOT in the last 168 hours.  Invalid input(s): GFRCGP ------------------------------------------------------------------------------------------------------------------  ------------------------------------------------------------------------------------------------------------------ GFR: CrCl cannot be calculated (No successful lab value found.). Liver Function Tests: No results for input(s): AST, ALT, ALKPHOS, BILITOT, PROT, ALBUMIN in the last 168 hours. No results for input(s): LIPASE, AMYLASE in the last 168 hours. No results for input(s): AMMONIA in the last 168 hours. Coagulation Profile: No results for input(s): INR, PROTIME in the last 168 hours. Cardiac Enzymes: No results for input(s): CKTOTAL, CKMB, CKMBINDEX, TROPONINI in the last 168 hours. BNP (last 3  results) No results for input(s): PROBNP in the last 8760 hours. HbA1C: No results for input(s): HGBA1C in the last 72 hours. CBG: No results for input(s): GLUCAP in the last 168 hours. Lipid Profile: No results for input(s): CHOL, HDL, LDLCALC, TRIG, CHOLHDL, LDLDIRECT in the last 72 hours. Thyroid Function Tests: No results for input(s): TSH, T4TOTAL, FREET4, T3FREE, THYROIDAB in the last 72 hours. Anemia Panel: No results for input(s): VITAMINB12, FOLATE, FERRITIN, TIBC, IRON, RETICCTPCT in the last 72 hours.  --------------------------------------------------------------------------------------------------------------- Urine analysis: No results found for: COLORURINE, APPEARANCEUR, LABSPEC, PHURINE, GLUCOSEU, HGBUR, BILIRUBINUR, KETONESUR, PROTEINUR, UROBILINOGEN, NITRITE, LEUKOCYTESUR    Imaging Results:    No results  found.     Assessment & Plan:    Active Problems:   Abdominal pain   AF (paroxysmal atrial fibrillation) (HCC)   Hypokalemia   Serum lipase elevation   Abdominal pain, ddx incarcerated/ ischemic bowel, vs obstruction Cont NGT to low intermittent suction NPO except ice chips Ns iv Dilaudid 0.5mg  iv q6h prn Surgery consulted and Dr. Grandville Silos evaluated the patient, appreciate input.  Check cbc, cmp in am  Nausea zofran 4mg  iv q6h prn   Lipase elevation, unclear etiology  ? Bowel entrapment Repeat Lipase in am  Hypokalemia Replete Check cmp in am  Hypomagnesemia Magnesium 1gm iv x1 Check magnesium in am  Pafib HOLD Eliquis (in anticipation of possible need for surgery) Cont Cardizem  Gerd DC prilosec Pepcid 20mg  iv bid  LE edema Lasix 20mg  iv qday   DVT Prophylaxis-  SCDs   AM Labs Ordered, also please review Full Orders  Family Communication: Admission, patients condition and plan of care including tests being ordered have been discussed with the patient who indicate understanding and agree with the plan and Code Status.  Code  Status:   FULL CODE  Admission status: /Inpatient: Based on patients clinical presentation and evaluation of above clinical data, I have made determination that patient meets Inpatient criteria at this time.pt requires possible surgical intervention for ? Ischemic bowel and will require NPO, and iv fluids.  Her incarcerated bowel / ischemic bowel will likely require more than 2 nites stay to resolve this matter.   Time spent in minutes : 70    Jani Gravel M.D on 08/12/2018 at 1:39 AM

## 2018-08-12 ENCOUNTER — Inpatient Hospital Stay (HOSPITAL_COMMUNITY): Payer: Medicare Other

## 2018-08-12 DIAGNOSIS — K56609 Unspecified intestinal obstruction, unspecified as to partial versus complete obstruction: Secondary | ICD-10-CM

## 2018-08-12 LAB — CBC
HCT: 30.3 % — ABNORMAL LOW (ref 36.0–46.0)
Hemoglobin: 10 g/dL — ABNORMAL LOW (ref 12.0–15.0)
MCH: 28.4 pg (ref 26.0–34.0)
MCHC: 33 g/dL (ref 30.0–36.0)
MCV: 86.1 fL (ref 80.0–100.0)
Platelets: 252 10*3/uL (ref 150–400)
RBC: 3.52 MIL/uL — ABNORMAL LOW (ref 3.87–5.11)
RDW: 13.7 % (ref 11.5–15.5)
WBC: 9.1 10*3/uL (ref 4.0–10.5)
nRBC: 0 % (ref 0.0–0.2)

## 2018-08-12 LAB — MAGNESIUM: Magnesium: 2.9 mg/dL — ABNORMAL HIGH (ref 1.7–2.4)

## 2018-08-12 LAB — COMPREHENSIVE METABOLIC PANEL
ALT: 13 U/L (ref 0–44)
AST: 15 U/L (ref 15–41)
Albumin: 3.3 g/dL — ABNORMAL LOW (ref 3.5–5.0)
Alkaline Phosphatase: 67 U/L (ref 38–126)
Anion gap: 10 (ref 5–15)
BUN: 15 mg/dL (ref 8–23)
CO2: 40 mmol/L — ABNORMAL HIGH (ref 22–32)
Calcium: 8.9 mg/dL (ref 8.9–10.3)
Chloride: 95 mmol/L — ABNORMAL LOW (ref 98–111)
Creatinine, Ser: 0.93 mg/dL (ref 0.44–1.00)
GFR calc Af Amer: >60 mL/min (ref >60)
GFR calc non Af Amer: 57 mL/min — ABNORMAL LOW (ref >60)
Glucose, Bld: 111 mg/dL — ABNORMAL HIGH (ref 70–99)
Potassium: 2.3 mmol/L — CL (ref 3.5–5.1)
Sodium: 145 mmol/L (ref 135–145)
Total Bilirubin: 0.7 mg/dL (ref 0.3–1.2)
Total Protein: 6.1 g/dL — ABNORMAL LOW (ref 6.5–8.1)

## 2018-08-12 LAB — BASIC METABOLIC PANEL
Anion gap: 9 (ref 5–15)
Anion gap: 8 (ref 5–15)
BUN: 13 mg/dL (ref 8–23)
BUN: 11 mg/dL (ref 8–23)
CO2: 39 mmol/L — ABNORMAL HIGH (ref 22–32)
CO2: 37 mmol/L — ABNORMAL HIGH (ref 22–32)
Calcium: 8.7 mg/dL — ABNORMAL LOW (ref 8.9–10.3)
Calcium: 8.7 mg/dL — ABNORMAL LOW (ref 8.9–10.3)
Chloride: 98 mmol/L (ref 98–111)
Chloride: 99 mmol/L (ref 98–111)
Creatinine, Ser: 0.94 mg/dL (ref 0.44–1.00)
Creatinine, Ser: 0.93 mg/dL (ref 0.44–1.00)
GFR calc Af Amer: >60 mL/min (ref >60)
GFR calc Af Amer: >60 mL/min (ref >60)
GFR calc non Af Amer: 56 mL/min — ABNORMAL LOW (ref >60)
GFR calc non Af Amer: 57 mL/min — ABNORMAL LOW (ref >60)
Glucose, Bld: 104 mg/dL — ABNORMAL HIGH (ref 70–99)
Glucose, Bld: 92 mg/dL (ref 70–99)
Potassium: 2.7 mmol/L — CL (ref 3.5–5.1)
Potassium: 3.4 mmol/L — ABNORMAL LOW (ref 3.5–5.1)
Sodium: 146 mmol/L — ABNORMAL HIGH (ref 135–145)
Sodium: 144 mmol/L (ref 135–145)

## 2018-08-12 LAB — LIPASE, BLOOD: Lipase: 50 U/L (ref 11–51)

## 2018-08-12 LAB — TSH: TSH: 1.214 u[IU]/mL (ref 0.350–4.500)

## 2018-08-12 MED ORDER — DIATRIZOATE MEGLUMINE & SODIUM 66-10 % PO SOLN
90.0000 mL | Freq: Once | ORAL | Status: AC
  Administered 2018-08-12: 10:00:00 90 mL via NASOGASTRIC
  Filled 2018-08-12: qty 90

## 2018-08-12 MED ORDER — POTASSIUM CHLORIDE 10 MEQ/100ML IV SOLN
10.0000 meq | INTRAVENOUS | Status: AC
  Administered 2018-08-12 (×6): 10 meq via INTRAVENOUS
  Filled 2018-08-12 (×5): qty 100

## 2018-08-12 MED ORDER — POTASSIUM CHLORIDE CRYS ER 20 MEQ PO TBCR: 40.0000 meq | EXTENDED_RELEASE_TABLET | Freq: Two times a day (BID) | ORAL | Status: DC

## 2018-08-12 MED ORDER — POTASSIUM CHLORIDE 10 MEQ/100ML IV SOLN
10.0000 meq | INTRAVENOUS | Status: AC
  Administered 2018-08-12 (×3): 10 meq via INTRAVENOUS
  Filled 2018-08-12 (×5): qty 100

## 2018-08-12 MED ORDER — MENTHOL 3 MG MT LOZG: 1.0000 | LOZENGE | OROMUCOSAL | Status: DC | PRN

## 2018-08-12 MED ORDER — HEPARIN SODIUM (PORCINE) 5000 UNIT/ML IJ SOLN
5000.0000 [IU] | Freq: Three times a day (TID) | INTRAMUSCULAR | Status: DC
  Administered 2018-08-12 – 2018-08-15 (×9): 5000 [IU] via SUBCUTANEOUS
  Filled 2018-08-12 (×9): qty 1

## 2018-08-12 NOTE — Progress Notes (Signed)
Suction turned off, gastrografin given

## 2018-08-12 NOTE — Progress Notes (Signed)
Dr. Karleen Hampshire text paged, patient states they cannot take oral pills without applesauce due to swallowing issues

## 2018-08-12 NOTE — Progress Notes (Addendum)
[facility] Progress Note      Assessment/Plan A. Fib - eliquis on hold  Large parastomal hernia containing stomach, colon, and small bowel likely causing a SBO - hernia is soft today and reducible but no gas or stool in ostomy - will continue NGT for today and see if pt has return of bowel function before deciding if she needs surgery this admission  - SBO protocol with gastrografin today  FEN: NGT, NPO VTE: SCD's, per medicine but okay for heparin from our standpoint, continue to hold eliquis in the event the pt needs surgery ID: none Follow up: TBD    LOS: 1 day    Subjective: CC: parastomal hernia  Pt denies abdominal pain. She is feeling better overall. Her throat is sore from the NGT. She states her ostomy bag was changed yesterday morning and she has not had any output or gas since.   Objective: Vital signs in last 24 hours: Temp:  [97.9 F (36.6 C)-98.3 F (36.8 C)] 97.9 F (36.6 C) (04/27 0316) Pulse Rate:  [78-82] 82 (04/27 0316) BP: (131-141)/(80-86) 131/80 (04/27 0316) SpO2:  [100 %] 100 % (04/27 0316) Weight:  [69.8 kg] 69.8 kg (04/27 0500) Last BM Date: 08/11/18  Intake/Output from previous day: 04/26 0701 - 04/27 0700 In: 302 [I.V.:252; IV Piggyback:50] Out: 950 [Emesis/NG output:950] Intake/Output this shift: No intake/output data recorded.  PE: Gen:  Alert, NAD, pleasant, cooperative Pulm:  Newell in place, rate and effort normal Abd: Soft, ND, +BS, Large left-sided, soft, parastomal hernia that reduces but immediately recurs, very mild tenderness here, no generalized tenderness, stoma is pink, no peritonitis, no gas or stool in bag.  Skin: no rashes noted, warm and dry   Anti-infectives: Anti-infectives (From admission, onward)   None      Lab Results:  Recent Labs    08/12/18 0300  WBC 9.1  HGB 10.0*  HCT 30.3*  PLT 252   BMET Recent Labs    08/12/18 0300  NA 145  K 2.3*  CL 95*  CO2 40*  GLUCOSE 111*  BUN  15  CREATININE 0.93  CALCIUM 8.9   PT/INR No results for input(s): LABPROT, INR in the last 72 hours. CMP     Component Value Date/Time   NA 145 08/12/2018 0300   K 2.3 (LL) 08/12/2018 0300   CL 95 (L) 08/12/2018 0300   CO2 40 (H) 08/12/2018 0300   GLUCOSE 111 (H) 08/12/2018 0300   BUN 15 08/12/2018 0300   CREATININE 0.93 08/12/2018 0300   CALCIUM 8.9 08/12/2018 0300   PROT 6.1 (L) 08/12/2018 0300   ALBUMIN 3.3 (L) 08/12/2018 0300   AST 15 08/12/2018 0300   ALT 13 08/12/2018 0300   ALKPHOS 67 08/12/2018 0300   BILITOT 0.7 08/12/2018 0300   GFRNONAA 57 (L) 08/12/2018 0300   GFRAA >60 08/12/2018 0300   Lipase     Component Value Date/Time   LIPASE 50 08/12/2018 0300    Studies/Results: No results found.    Kalman Drape , [facility] 08/12/2018, 8:34 AM  Pager: [phone] Mon-Wed, Friday 7:00am-4:30pm Thurs 7am-11:30am  Consults: [phone]

## 2018-08-12 NOTE — Progress Notes (Signed)
CRITICAL VALUE ALERT  Critical Value:  2.7  Date & Time Notied:  08/12/2018 at 9:43am  Provider Notified: Dr. Karleen Hampshire  Orders Received/Actions taken: awaiting interventions

## 2018-08-12 NOTE — Progress Notes (Signed)
CRITICAL VALUE ALERT  Critical Value:  Potassium 2.3  Date & Time Notied:  04/27 : 0423  Provider Notified: TRH Bodenheimer  Orders Received/Actions taken: IV Potassium 10 mEq every 1 hour x 6

## 2018-08-12 NOTE — Progress Notes (Signed)
PROGRESS NOTE    Anna Harding  TDV:[id number] DOB: Jan 11, 1933 DOA: 08/11/2018 PCP: Patient, No Pcp Per   Brief Narrative: Anna Harding  is a 85 y.o. female,w Pafib, chronic lower ext edema, h/o diverticulitis s/p sigmoid colon resection w colostomy apparently c/o abdominal pain with eating over the past several weeks, with some nausea, was found to have small bowel obstruction in the large parastomal hernia.   Assessment & Plan:   Active Problems:   Abdominal pain   AF (paroxysmal atrial fibrillation) (HCC)   Hypokalemia   Serum lipase elevation   Parastomal hernia with SBO:  NG tube placed and is on intermittent suction. Conservative measures so far.  NPO, IV fluids, Surgery consult and allow the bowel function to return .  If no improvement , surgery plan for intervention.     Paroxysmal atrial fibrillation:  Rate controlled.  Holding eliquis for surgery.    Hypokalemia:  IV replacement in progress. Repeat level tonight.  Mag wnl.    Anemia of chronic disease:  Hemoglobin around 10,. Continue to monitor.    DVT prophylaxis: heparin. Code Status: full code.  Family Communication: none at bedside, will call his son and update him.  Disposition Plan: Pending clinical improvement.   Consultants:   Surgery.   Procedures:  None.  Antimicrobials: none.   Subjective: No chest pain, or sob. abd distention improving.  Pain better, still very uncomfortable , nauseated.   Objective: Vitals:   08/11/18 2215 08/12/18 0316 08/12/18 0500 08/12/18 0954  BP: (!) 141/86 131/80  126/81  Pulse: 78 82    Temp: 98.3 F (36.8 C) 97.9 F (36.6 C)    TempSrc: Oral Oral    SpO2: 100% 100%    Weight: 69.8 kg  69.8 kg     Intake/Output Summary (Last 24 hours) at 08/12/2018 1107 Last data filed at 08/12/2018 1035 Gross per 24 hour  Intake 302.04 ml  Output 1200 ml  Net -897.96 ml   Filed Weights   08/11/18 2215 08/12/18 0500  Weight: 69.8 kg 69.8 kg    Examination:   General exam: Appears calm and comfortable  Respiratory system: Clear to auscultation. Respiratory effort normal. No wheezing or rhonchi.  Cardiovascular system: S1 & S2 heard, RRR. No pedal edema. Gastrointestinal system: Abdomen is soft tender around the colostomy, distended, bowel sounds good.  Central nervous system: Alert and oriented. No focal neurological deficits. Extremities: Symmetric 5 x 5 power. Skin: No rashes, lesions or ulcers Psychiatry:  Mood & affect appropriate.     Data Reviewed: I have personally reviewed following labs and imaging studies  CBC: Recent Labs  Lab 08/12/18 0300  WBC 9.1  HGB 10.0*  HCT 30.3*  MCV 86.1  PLT 062   Basic Metabolic Panel: Recent Labs  Lab 08/12/18 0300 08/12/18 0832  NA 145 146*  K 2.3* 2.7*  CL 95* 98  CO2 40* 39*  GLUCOSE 111* 104*  BUN 15 13  CREATININE 0.93 0.94  CALCIUM 8.9 8.7*  MG 2.9*  --    GFR: CrCl cannot be calculated (Unknown ideal weight.). Liver Function Tests: Recent Labs  Lab 08/12/18 0300  AST 15  ALT 13  ALKPHOS 67  BILITOT 0.7  PROT 6.1*  ALBUMIN 3.3*   Recent Labs  Lab 08/12/18 0300  LIPASE 50   No results for input(s): AMMONIA in the last 168 hours. Coagulation Profile: No results for input(s): INR, PROTIME in the last 168 hours. Cardiac Enzymes: No results for input(s):  CKTOTAL, CKMB, CKMBINDEX, TROPONINI in the last 168 hours. BNP (last 3 results) No results for input(s): PROBNP in the last 8760 hours. HbA1C: No results for input(s): HGBA1C in the last 72 hours. CBG: No results for input(s): GLUCAP in the last 168 hours. Lipid Profile: No results for input(s): CHOL, HDL, LDLCALC, TRIG, CHOLHDL, LDLDIRECT in the last 72 hours. Thyroid Function Tests: Recent Labs    08/12/18 0300  TSH 1.214   Anemia Panel: No results for input(s): VITAMINB12, FOLATE, FERRITIN, TIBC, IRON, RETICCTPCT in the last 72 hours. Sepsis Labs: No results for input(s): PROCALCITON, LATICACIDVEN  in the last 168 hours.  No results found for this or any previous visit (from the past 240 hour(s)).       Radiology Studies: No results found.      Scheduled Meds: . diltiazem  180 mg Oral Daily  . furosemide  20 mg Intravenous Daily   Continuous Infusions: . 0.9 % NaCl with KCl 20 mEq / L 75 mL/hr at 08/12/18 0008  . famotidine (PEPCID) IV 20 mg (08/12/18 1026)  . potassium chloride 10 mEq (08/12/18 1034)  . potassium chloride       LOS: 1 day    Time spent: 35 minutes    Hosie Poisson, MD [facility] Pager [phone]   If 7PM-7AM, please contact night-coverage [email or web address] Password [facility] 08/12/2018, 11:07 AM

## 2018-08-12 NOTE — Progress Notes (Addendum)
Started low intermittent suction on again after gastrografin, pt tolerating well. Called radiology for scan, stated they will be up for scan in about 6 1/2 hours

## 2018-08-12 NOTE — Progress Notes (Signed)
Dr. Karleen Hampshire text paged, pt is running on IV potassium, unable to take oral potassium while NPO per patient.

## 2018-08-13 ENCOUNTER — Inpatient Hospital Stay (HOSPITAL_COMMUNITY): Payer: Medicare Other

## 2018-08-13 DIAGNOSIS — I10 Essential (primary) hypertension: Secondary | ICD-10-CM

## 2018-08-13 LAB — BASIC METABOLIC PANEL
Anion gap: 9 (ref 5–15)
BUN: 12 mg/dL (ref 8–23)
CO2: 32 mmol/L (ref 22–32)
Calcium: 8.8 mg/dL — ABNORMAL LOW (ref 8.9–10.3)
Chloride: 101 mmol/L (ref 98–111)
Creatinine, Ser: 0.93 mg/dL (ref 0.44–1.00)
GFR calc Af Amer: >60 mL/min (ref >60)
GFR calc non Af Amer: 57 mL/min — ABNORMAL LOW (ref >60)
Glucose, Bld: 80 mg/dL (ref 70–99)
Potassium: 3.3 mmol/L — ABNORMAL LOW (ref 3.5–5.1)
Sodium: 142 mmol/L (ref 135–145)

## 2018-08-13 LAB — CBC
HCT: 32.5 % — ABNORMAL LOW (ref 36.0–46.0)
Hemoglobin: 10.4 g/dL — ABNORMAL LOW (ref 12.0–15.0)
MCH: 28.1 pg (ref 26.0–34.0)
MCHC: 32 g/dL (ref 30.0–36.0)
MCV: 87.8 fL (ref 80.0–100.0)
Platelets: 224 K/uL (ref 150–400)
RBC: 3.7 MIL/uL — ABNORMAL LOW (ref 3.87–5.11)
RDW: 13.3 % (ref 11.5–15.5)
WBC: 7 K/uL (ref 4.0–10.5)
nRBC: 0 % (ref 0.0–0.2)

## 2018-08-13 MED ORDER — POTASSIUM CHLORIDE CRYS ER 20 MEQ PO TBCR
40.0000 meq | EXTENDED_RELEASE_TABLET | Freq: Once | ORAL | Status: AC
  Administered 2018-08-13: 11:00:00 40 meq via ORAL
  Filled 2018-08-13: qty 2

## 2018-08-13 MED ORDER — TORSEMIDE 20 MG PO TABS
20.0000 mg | ORAL_TABLET | Freq: Two times a day (BID) | ORAL | Status: DC
  Administered 2018-08-14 – 2018-08-15 (×3): 20 mg via ORAL
  Filled 2018-08-13 (×4): qty 1

## 2018-08-13 MED ORDER — PANTOPRAZOLE SODIUM 40 MG PO TBEC
40.0000 mg | DELAYED_RELEASE_TABLET | Freq: Every day | ORAL | Status: DC
  Administered 2018-08-13 – 2018-08-15 (×3): 40 mg via ORAL
  Filled 2018-08-13 (×3): qty 1

## 2018-08-13 MED ORDER — POTASSIUM CHLORIDE 10 MEQ/100ML IV SOLN
INTRAVENOUS | Status: AC
  Administered 2018-08-13: 18:00:00
  Filled 2018-08-13: qty 100

## 2018-08-13 MED ORDER — MORPHINE SULFATE (PF) 2 MG/ML IV SOLN
2.0000 mg | INTRAVENOUS | Status: DC | PRN
  Administered 2018-08-13: 16:00:00 2 mg via INTRAVENOUS
  Filled 2018-08-13: qty 1

## 2018-08-13 MED ORDER — POTASSIUM CHLORIDE 10 MEQ/100ML IV SOLN
10.0000 meq | INTRAVENOUS | Status: AC
  Administered 2018-08-13 (×4): 10 meq via INTRAVENOUS
  Filled 2018-08-13 (×3): qty 100

## 2018-08-13 MED ORDER — POTASSIUM CHLORIDE CRYS ER 20 MEQ PO TBCR
20.0000 meq | EXTENDED_RELEASE_TABLET | Freq: Every day | ORAL | Status: DC
  Administered 2018-08-14 – 2018-08-15 (×2): 20 meq via ORAL
  Filled 2018-08-13 (×2): qty 1

## 2018-08-13 NOTE — Progress Notes (Signed)
[facility]/Trauma Progress Note      Assessment/Plan A. Fib - eliquis on hold  Large parastomal hernia containing stomach, colon, and small bowel likely causing a SBO - hernia is soft and reducible but no gas in ostomy, increased pain this am, VSS - contrast seen on colon and normal bowel gas pattern on AXR this am. Stool in bag but no gas.  - clamp NGT and allow clears   FEN: NGT clamped, CLD VTE: SCD's, per medicine but okay for heparin from our standpoint, continue to hold eliquis in the event the pt needs surgery ID: none Follow up: TBD    LOS: 2 days    Subjective: CC: abdominal pain  Crampy abdominal pain across her abdomen that started after they moved her for AXR around 0700. Mild nausea. Pain is not normal pain for her. She states no gas in bag. Stool in bag that she states started last evening.   Objective: Vital signs in last 24 hours: Temp:  [97.7 F (36.5 C)-98.2 F (36.8 C)] 97.7 F (36.5 C) (04/28 0418) Pulse Rate:  [73-80] 76 (04/28 0418) Resp:  [16] 16 (04/28 0418) BP: (126-141)/(81-88) 141/88 (04/28 0418) SpO2:  [98 %-100 %] 98 % (04/28 0418) Weight:  [71.4 kg] 71.4 kg (04/28 0500) Last BM Date: 08/11/18  Intake/Output from previous day: 04/27 0701 - 04/28 0700 In: 931.4 [NG/GT:40; IV Piggyback:891.4] Out: 1850 [Urine:1600; Emesis/NG output:250] Intake/Output this shift: No intake/output data recorded.  PE: Gen:  Alert, NAD, appears uncomfortable Pulm:  [place] in place, rate and effort normal Abd: Soft, ND, +BS, Large left-sided, soft, parastomal hernia that reduces but immediately recurs, very mild tenderness here, generalized tenderness with mild guarding, stoma is pink, no peritonitis, no gas or stool in bag. Skin: no rashes noted, warm and dry   Anti-infectives: Anti-infectives (From admission, onward)   None      Lab Results:  Recent Labs    08/12/18 0300 08/13/18 0653  WBC 9.1 7.0  HGB 10.0* 10.4*  HCT 30.3*  32.5*  PLT 252 224   BMET Recent Labs    08/12/18 1820 08/13/18 0653  NA 144 142  K 3.4* 3.3*  CL 99 101  CO2 37* 32  GLUCOSE 92 80  BUN 11 12  CREATININE 0.93 0.93  CALCIUM 8.7* 8.8*   PT/INR No results for input(s): LABPROT, INR in the last 72 hours. CMP     Component Value Date/Time   NA 142 08/13/2018 0653   K 3.3 (L) 08/13/2018 0653   CL 101 08/13/2018 0653   CO2 32 08/13/2018 0653   GLUCOSE 80 08/13/2018 0653   BUN 12 08/13/2018 0653   CREATININE 0.93 08/13/2018 0653   CALCIUM 8.8 (L) 08/13/2018 0653   PROT 6.1 (L) 08/12/2018 0300   ALBUMIN 3.3 (L) 08/12/2018 0300   AST 15 08/12/2018 0300   ALT 13 08/12/2018 0300   ALKPHOS 67 08/12/2018 0300   BILITOT 0.7 08/12/2018 0300   GFRNONAA 57 (L) 08/13/2018 0653   GFRAA >60 08/13/2018 0653   Lipase     Component Value Date/Time   LIPASE 50 08/12/2018 0300    Studies/Results: Dg Abd Portable 1v-small Bowel Obstruction Protocol-24 Hr Delay  Result Date: 08/13/2018 CLINICAL DATA:  Small bowel obstruction.  Less distension today. EXAM: PORTABLE ABDOMEN - 1 VIEW COMPARISON:  08/12/2018 FINDINGS: Enteric tube has tip in the expected region of the gastroesophageal junction unchanged. No dilated small bowel loops. Contrast is present throughout the colon. Evidence of patient's  colostomy site over the lower left abdomen/pelvis. No free peritoneal air. Remainder the exam is unchanged. IMPRESSION: Nonobstructive bowel gas pattern with contrast throughout the colon. Colostomy site left lower abdomen/pelvis. Enteric tube unchanged with tip in the expected region of the gastroesophageal junction. Electronically Signed   By: Marin Olp M.D.   On: 08/13/2018 09:02   Dg Abd Portable 1v-small Bowel Obstruction Protocol-initial, 8 Hr Delay  Result Date: 08/12/2018 CLINICAL DATA:  85 year old female with a history of small bowel obstruction EXAM: PORTABLE ABDOMEN - 1 VIEW COMPARISON:  CT 08/11/2018 FINDINGS: Retained enteric contrast  within the length of the colon. Paucity of small bowel gas. Small volume gas within the stomach, which is decompressed from the prior CT. Gastric tube terminates within the stomach. Degenerative changes without displaced fracture. IMPRESSION: Decompressed stomach with gastric tube in place. Enteric contrast throughout the length of the colon. No evidence of small bowel obstruction. Electronically Signed   By: Corrie Mckusick D.O.   On: 08/12/2018 20:19      Kalman Drape , [facility] Surgery 08/13/2018, 9:19 AM  Pager: [phone] Mon-Wed, Friday 7:00am-4:30pm Thurs 7am-11:30am  Consults: [phone]

## 2018-08-13 NOTE — Progress Notes (Signed)
PROGRESS NOTE    GERMANY DODGEN  TFT:[id number] DOB: [birth year] DOA: 08/11/2018 PCP: Patient, No Pcp Per      Brief Narrative:  Mrs. Underdown is a 85 y.o. F with Afib on Eliquis and diverticulitis requiring colectomy with ostomy who presented with abdominal pain, cramps, vomiting for 24 hours.  In the ER at [facility], CT abdomen showed large peristomal hernia with concern for entrapment and developing bowel ischemia.    Assessment & Plan:  Small bowel obstruction Parastomal hernia [facility] with immediate 2.5L output and improvement in symptoms.  Got gastrograffin GI series on arrival and this showed passage of contrast to the colon.    Overnight had some cramping, but NG output low and she had stool output in ostomy this morning, as well as repeat AXR showed again contrast passage. -Clamp NG -Trial clears -Consult General Surgery   Atrial fibrillation, paroxysmal -Hold Eliquis, Surgery still pondering operative intervention -Continue diltiazem  Hypertension BP normal -Hold hydralazine for now -Transition Lasix back to home Torsemide, reduce to once daily for now  Hypokalemia Mag replete -Supplement K  Other medicaitons -Continue home PPI, stop Pepcid  Normocytic anemia Likely chronic disease, stable relative to baseline     MDM and disposition: The below labs and imaging reports were reviewed and summarized above.  Medication management as above.  The patient was admitted with SBO.  NG to be clamped today.  Gen Surg mulling surgery for parastomal hernia.          DVT prophylaxis: heparin Code Status: FULL Family Communication: None    Consultants:   General Surgery  Procedures:  GI series  Antimicrobials:   None    Subjective: Abdominal cramps this morning, no vomiting through NG, NG output reduced.  No fever, cough.  No confusion.  No hematochezia, melena.  No palpitaions, chest pain, dyspnea.  Objective: Vitals:   08/12/18 2124 08/13/18 0418 08/13/18 0500 08/13/18 1458  BP: 140/88 (!) 141/88  113/74  Pulse: 73 76  (!) 58  Resp: 16 16  16   Temp: 97.7 F (36.5 C) 97.7 F (36.5 C)  98.2 F (36.8 C)  TempSrc: Oral Oral  Oral  SpO2: 99% 98%  100%  Weight:   71.4 kg     Intake/Output Summary (Last 24 hours) at 08/13/2018 1742 Last data filed at 08/13/2018 1242 Gross per 24 hour  Intake 240 ml  Output 1000 ml  Net -760 ml   Filed Weights   08/11/18 2215 08/12/18 0500 08/13/18 0500  Weight: 69.8 kg 69.8 kg 71.4 kg    Examination: General appearance:  adult female, alert and in no acute distress.  Appears tired, anxious. HEENT: Anicteric, conjunctiva pink, lids and lashes normal. No nasal deformity, discharge, epistaxis.  Lips moist.   Skin: Warm and dry.  no jaundice.  No suspicious rashes or lesions. Cardiac: RRR, nl S1-S2, no murmurs appreciated.  Capillary refill is brisk.  JVP not visible.  No LE edema.  Radia pulses 2+ and symmetric. Respiratory: Normal respiratory rate and rhythm.  CTAB without rales or wheezes. Abdomen: Abdomen soft.  Moderate nonfocal TTP with voluntary guarding, no rigidity or rebound, parastomal hernia soft reducible. No ascites, distension, hepatosplenomegaly.   MSK: No deformities or effusions. Thin, muscle bulk normal for age Neuro: Awake and alert.  EOMI, moves all extremities. Speech fluent.    Psych: Sensorium intact and responding to questions, attention normal. Affect normal.  Judgment and insight appear normal.    Data Reviewed:  I have personally reviewed following labs and imaging studies:  CBC: Recent Labs  Lab 08/12/18 0300 08/13/18 0653  WBC 9.1 7.0  HGB 10.0* 10.4*  HCT 30.3* 32.5*  MCV 86.1 87.8  PLT 252 258   Basic Metabolic Panel: Recent Labs  Lab 08/12/18 0300 08/12/18 0832 08/12/18 1820 08/13/18 0653  NA 145 146* 144 142  K 2.3* 2.7* 3.4* 3.3*  CL 95* 98 99 101  CO2 40* 39* 37* 32  GLUCOSE 111* 104* 92 80  BUN 15 13 11 12    CREATININE 0.93 0.94 0.93 0.93  CALCIUM 8.9 8.7* 8.7* 8.8*  MG 2.9*  --   --   --    GFR: CrCl cannot be calculated (Unknown ideal weight.). Liver Function Tests: Recent Labs  Lab 08/12/18 0300  AST 15  ALT 13  ALKPHOS 67  BILITOT 0.7  PROT 6.1*  ALBUMIN 3.3*   Recent Labs  Lab 08/12/18 0300  LIPASE 50   No results for input(s): AMMONIA in the last 168 hours. Coagulation Profile: No results for input(s): INR, PROTIME in the last 168 hours. Cardiac Enzymes: No results for input(s): CKTOTAL, CKMB, CKMBINDEX, TROPONINI in the last 168 hours. BNP (last 3 results) No results for input(s): PROBNP in the last 8760 hours. HbA1C: No results for input(s): HGBA1C in the last 72 hours. CBG: No results for input(s): GLUCAP in the last 168 hours. Lipid Profile: No results for input(s): CHOL, HDL, LDLCALC, TRIG, CHOLHDL, LDLDIRECT in the last 72 hours. Thyroid Function Tests: Recent Labs    08/12/18 0300  TSH 1.214   Anemia Panel: No results for input(s): VITAMINB12, FOLATE, FERRITIN, TIBC, IRON, RETICCTPCT in the last 72 hours. Urine analysis: No results found for: COLORURINE, APPEARANCEUR, LABSPEC, PHURINE, GLUCOSEU, HGBUR, BILIRUBINUR, KETONESUR, PROTEINUR, UROBILINOGEN, NITRITE, LEUKOCYTESUR Sepsis Labs: @LABRCNTIP (procalcitonin:4,lacticacidven:4)  )No results found for this or any previous visit (from the past 240 hour(s)).       Radiology Studies: Dg Abd Portable 1v-small Bowel Obstruction Protocol-24 Hr Delay  Result Date: 08/13/2018 CLINICAL DATA:  Small bowel obstruction.  Less distension today. EXAM: PORTABLE ABDOMEN - 1 VIEW COMPARISON:  08/12/2018 FINDINGS: Enteric tube has tip in the expected region of the gastroesophageal junction unchanged. No dilated small bowel loops. Contrast is present throughout the colon. Evidence of patient's colostomy site over the lower left abdomen/pelvis. No free peritoneal air. Remainder the exam is unchanged. IMPRESSION:  Nonobstructive bowel gas pattern with contrast throughout the colon. Colostomy site left lower abdomen/pelvis. Enteric tube unchanged with tip in the expected region of the gastroesophageal junction. Electronically Signed   By: Marin Olp M.D.   On: 08/13/2018 09:02   Dg Abd Portable 1v-small Bowel Obstruction Protocol-initial, 8 Hr Delay  Result Date: 08/12/2018 CLINICAL DATA:  85 year old female with a history of small bowel obstruction EXAM: PORTABLE ABDOMEN - 1 VIEW COMPARISON:  CT 08/11/2018 FINDINGS: Retained enteric contrast within the length of the colon. Paucity of small bowel gas. Small volume gas within the stomach, which is decompressed from the prior CT. Gastric tube terminates within the stomach. Degenerative changes without displaced fracture. IMPRESSION: Decompressed stomach with gastric tube in place. Enteric contrast throughout the length of the colon. No evidence of small bowel obstruction. Electronically Signed   By: Corrie Mckusick D.O.   On: 08/12/2018 20:19        Scheduled Meds: . diltiazem  180 mg Oral Daily  . furosemide  20 mg Intravenous Daily  . heparin injection (subcutaneous)  5,000 Units Subcutaneous Q8H  Continuous Infusions: . famotidine (PEPCID) IV 20 mg (08/13/18 1031)     LOS: 2 days    Time spent: 25 minutes    Edwin Dada, MD [facility] 08/13/2018, 5:42 PM     Please page through [place]:  [email or web address] Password [email or web address] If 7PM-7AM, please contact night-coverage

## 2018-08-13 NOTE — Plan of Care (Signed)

## 2018-08-14 ENCOUNTER — Encounter (HOSPITAL_COMMUNITY): Payer: Self-pay | Admitting: *Deleted

## 2018-08-14 LAB — BASIC METABOLIC PANEL
Anion gap: 9 (ref 5–15)
BUN: 18 mg/dL (ref 8–23)
CO2: 30 mmol/L (ref 22–32)
Calcium: 8.5 mg/dL — ABNORMAL LOW (ref 8.9–10.3)
Chloride: 100 mmol/L (ref 98–111)
Creatinine, Ser: 0.94 mg/dL (ref 0.44–1.00)
GFR calc Af Amer: >60 mL/min (ref >60)
GFR calc non Af Amer: 56 mL/min — ABNORMAL LOW (ref >60)
Glucose, Bld: 82 mg/dL (ref 70–99)
Potassium: 4.2 mmol/L (ref 3.5–5.1)
Sodium: 139 mmol/L (ref 135–145)

## 2018-08-14 LAB — CBC
HCT: 24.1 % — ABNORMAL LOW (ref 36.0–46.0)
Hemoglobin: 7.9 g/dL — ABNORMAL LOW (ref 12.0–15.0)
MCH: 28.4 pg (ref 26.0–34.0)
MCHC: 32.8 g/dL (ref 30.0–36.0)
MCV: 86.7 fL (ref 80.0–100.0)
Platelets: 220 10*3/uL (ref 150–400)
RBC: 2.78 MIL/uL — ABNORMAL LOW (ref 3.87–5.11)
RDW: 13.3 % (ref 11.5–15.5)
WBC: 7.5 10*3/uL (ref 4.0–10.5)
nRBC: 0 % (ref 0.0–0.2)

## 2018-08-14 LAB — HEMOGLOBIN AND HEMATOCRIT, BLOOD
HCT: 25.5 % — ABNORMAL LOW (ref 36.0–46.0)
Hemoglobin: 8.3 g/dL — ABNORMAL LOW (ref 12.0–15.0)

## 2018-08-14 NOTE — Progress Notes (Signed)
Pt had a very large amount of liquid stool from her colostomy which made the bag leak and come off.  Because of her peristomal hernia, will order convexity, but changed it for now with a 2 piece and a barrier ring and added foam around the edges to provide extra support.  Pt states it usually stays on there for about 3 days.  Contacted [place], Judeen Hammans for # of convexity pouch, will order.

## 2018-08-14 NOTE — Progress Notes (Signed)
PROGRESS NOTE    Anna Harding  DVV:[id number] DOB: [birth year] DOA: 08/11/2018 PCP: Patient, No Pcp Per   Brief Narrative:  Anna Harding is a 85 y.o. F with Afib on Eliquis and diverticulitis requiring colectomy with ostomy who presented with abdominal pain, cramps, vomiting for 24 hours.  In the ER at [facility], CT abdomen showed large peristomal hernia with concern for entrapment and developing bowel ischemia.   Assessment & Plan:   Active Problems:   Abdominal pain   AF (paroxysmal atrial fibrillation) (HCC)   Hypokalemia   Serum lipase elevation   Small bowel obstruction Parastomal hernia Appears to have cleared, general surgery following, Abdomen no longer distended, no pain On diet,   atrial fibrillation, paroxysmal -Request initially held, will restart, general surgery with no plans for operative intervention -Continue diltiazem  Hypertension BP soft most recent 95/59 with a 121 previously -Continue to hold hydralazine for now -Transition Lasix back to home Torsemide, reduce to once daily for now  Hypokalemia Mag repleteD -Supplement K when needed currently 4.2  Other medicaitons -Continue home PPI, stop Pepcid  Normocytic anemia Significant drop at 7.9 from 10.4, rechecked 8.3, will check again in the morning likely delutional   DVT prophylaxis: Heparin SQ  Code Status:     Code Status Orders  (From admission, onward)         Start     Ordered   08/11/18 2347  Full code  Continuous     08/11/18 2347        Code Status History    This patient has a current code status but no historical code status.    Advance Directive Documentation     Most Recent Value  Type of Advance Directive  Living will  Pre-existing out of facility DNR order (yellow form or pink MOST form)  -  "MOST" Form in Place?  -     Family Communication: Tried calling daily left message Disposition Plan:   Patient requires continued inpatient management with advancing  diet, she did have significant diarrhea x1 leading to from ostomy at requiring ostomy nursing.  Continue to monitor output replace fluids as lost advance diet as tolerated Consults called: None Admission status: Inpatient   Consultants:   General surgery  Procedures:  Dg Abd Portable 1v-small Bowel Obstruction Protocol-24 Hr Delay  Result Date: 08/13/2018 CLINICAL DATA:  Small bowel obstruction.  Less distension today. EXAM: PORTABLE ABDOMEN - 1 VIEW COMPARISON:  08/12/2018 FINDINGS: Enteric tube has tip in the expected region of the gastroesophageal junction unchanged. No dilated small bowel loops. Contrast is present throughout the colon. Evidence of patient's colostomy site over the lower left abdomen/pelvis. No free peritoneal air. Remainder the exam is unchanged. IMPRESSION: Nonobstructive bowel gas pattern with contrast throughout the colon. Colostomy site left lower abdomen/pelvis. Enteric tube unchanged with tip in the expected region of the gastroesophageal junction. Electronically Signed   By: Marin Olp M.D.   On: 08/13/2018 09:02   Dg Abd Portable 1v-small Bowel Obstruction Protocol-initial, 8 Hr Delay  Result Date: 08/12/2018 CLINICAL DATA:  85 year old female with a history of small bowel obstruction EXAM: PORTABLE ABDOMEN - 1 VIEW COMPARISON:  CT 08/11/2018 FINDINGS: Retained enteric contrast within the length of the colon. Paucity of small bowel gas. Small volume gas within the stomach, which is decompressed from the prior CT. Gastric tube terminates within the stomach. Degenerative changes without displaced fracture. IMPRESSION: Decompressed stomach with gastric tube in place. Enteric contrast throughout the length of  the colon. No evidence of small bowel obstruction. Electronically Signed   By: Corrie Mckusick D.O.   On: 08/12/2018 20:19     Antimicrobials:   None   Subjective: Patient reports she feels significantly better, eating soft diet currently, reports no  abdominal pain  Objective: Vitals:   08/13/18 2139 08/14/18 0354 08/14/18 0500 08/14/18 1353  BP: 106/65 121/73  (!) 95/59  Pulse: 76 93  61  Resp: 18 18  16   Temp: 98.4 F (36.9 C) 98 F (36.7 C)  98.5 F (36.9 C)  TempSrc: Oral Oral  Oral  SpO2: 100% 99%  100%  Weight:   72.5 kg     Intake/Output Summary (Last 24 hours) at 08/14/2018 1654 Last data filed at 08/14/2018 1336 Gross per 24 hour  Intake 242 ml  Output 1200 ml  Net -958 ml   Filed Weights   08/12/18 0500 08/13/18 0500 08/14/18 0500  Weight: 69.8 kg 71.4 kg 72.5 kg    Examination:  General exam: Appears calm and comfortable  Respiratory system: Clear to auscultation. Respiratory effort normal. Cardiovascular system: S1 & S2 heard, RRR. No JVD, murmurs, rubs, gallops or clicks. No pedal edema. Gastrointestinal system: Abdomen is nondistended, soft and nontender.  Ostomy in left lower quadrant with stool in ostomy bag no leakage noted  Central nervous system: Alert and oriented. No focal neurological deficits. Extremities: Symmetric 5 x 5 power.  Contractures Skin: No rashes, lesions or ulcers Psychiatry: Judgement and insight appear normal. Mood & affect appropriate.     Data Reviewed: I have personally reviewed following labs and imaging studies  CBC: Recent Labs  Lab 08/12/18 0300 08/13/18 0653 08/14/18 0214 08/14/18 0849  WBC 9.1 7.0 7.5  --   HGB 10.0* 10.4* 7.9* 8.3*  HCT 30.3* 32.5* 24.1* 25.5*  MCV 86.1 87.8 86.7  --   PLT 252 224 220  --    Basic Metabolic Panel: Recent Labs  Lab 08/12/18 0300 08/12/18 0832 08/12/18 1820 08/13/18 0653 08/14/18 0214  NA 145 146* 144 142 139  K 2.3* 2.7* 3.4* 3.3* 4.2  CL 95* 98 99 101 100  CO2 40* 39* 37* 32 30  GLUCOSE 111* 104* 92 80 82  BUN 15 13 11 12 18   CREATININE 0.93 0.94 0.93 0.93 0.94  CALCIUM 8.9 8.7* 8.7* 8.8* 8.5*  MG 2.9*  --   --   --   --    GFR: CrCl cannot be calculated (Unknown ideal weight.). Liver Function Tests: Recent  Labs  Lab 08/12/18 0300  AST 15  ALT 13  ALKPHOS 67  BILITOT 0.7  PROT 6.1*  ALBUMIN 3.3*   Recent Labs  Lab 08/12/18 0300  LIPASE 50   No results for input(s): AMMONIA in the last 168 hours. Coagulation Profile: No results for input(s): INR, PROTIME in the last 168 hours. Cardiac Enzymes: No results for input(s): CKTOTAL, CKMB, CKMBINDEX, TROPONINI in the last 168 hours. BNP (last 3 results) No results for input(s): PROBNP in the last 8760 hours. HbA1C: No results for input(s): HGBA1C in the last 72 hours. CBG: No results for input(s): GLUCAP in the last 168 hours. Lipid Profile: No results for input(s): CHOL, HDL, LDLCALC, TRIG, CHOLHDL, LDLDIRECT in the last 72 hours. Thyroid Function Tests: Recent Labs    08/12/18 0300  TSH 1.214   Anemia Panel: No results for input(s): VITAMINB12, FOLATE, FERRITIN, TIBC, IRON, RETICCTPCT in the last 72 hours. Sepsis Labs: No results for input(s): PROCALCITON, LATICACIDVEN in the  last 168 hours.  No results found for this or any previous visit (from the past 240 hour(s)).       Radiology Studies: Dg Abd Portable 1v-small Bowel Obstruction Protocol-24 Hr Delay  Result Date: 08/13/2018 CLINICAL DATA:  Small bowel obstruction.  Less distension today. EXAM: PORTABLE ABDOMEN - 1 VIEW COMPARISON:  08/12/2018 FINDINGS: Enteric tube has tip in the expected region of the gastroesophageal junction unchanged. No dilated small bowel loops. Contrast is present throughout the colon. Evidence of patient's colostomy site over the lower left abdomen/pelvis. No free peritoneal air. Remainder the exam is unchanged. IMPRESSION: Nonobstructive bowel gas pattern with contrast throughout the colon. Colostomy site left lower abdomen/pelvis. Enteric tube unchanged with tip in the expected region of the gastroesophageal junction. Electronically Signed   By: Marin Olp M.D.   On: 08/13/2018 09:02   Dg Abd Portable 1v-small Bowel Obstruction  Protocol-initial, 8 Hr Delay  Result Date: 08/12/2018 CLINICAL DATA:  85 year old female with a history of small bowel obstruction EXAM: PORTABLE ABDOMEN - 1 VIEW COMPARISON:  CT 08/11/2018 FINDINGS: Retained enteric contrast within the length of the colon. Paucity of small bowel gas. Small volume gas within the stomach, which is decompressed from the prior CT. Gastric tube terminates within the stomach. Degenerative changes without displaced fracture. IMPRESSION: Decompressed stomach with gastric tube in place. Enteric contrast throughout the length of the colon. No evidence of small bowel obstruction. Electronically Signed   By: Corrie Mckusick D.O.   On: 08/12/2018 20:19        Scheduled Meds: . diltiazem  180 mg Oral Daily  . heparin injection (subcutaneous)  5,000 Units Subcutaneous Q8H  . pantoprazole  40 mg Oral Daily  . potassium chloride  20 mEq Oral Daily  . torsemide  20 mg Oral BID   Continuous Infusions:   LOS: 3 days    Time spent: 35 min    Nicolette Bang, MD [facility]  If 7PM-7AM, please contact night-coverage  08/14/2018, 4:54 PM

## 2018-08-14 NOTE — Progress Notes (Signed)
Obtained 5 convex one piece pouches for pt since she has a peristomal hernia 2" (35009), placed in room and pt can take them home since they were charged to her to use.  Educated pt on it and she will see if she can order it from [place] where she gets her ostomy supplies from.  Her present pouch I changed earlier is working well.

## 2018-08-14 NOTE — Social Work (Signed)
If appropriate and needed please order PT/OT.  Westley Hummer, MSW, [place] [employer] [phone]

## 2018-08-14 NOTE — Progress Notes (Signed)
   Subjective/Chief Complaint: No complaints this am except for being hungry   Objective: Vital signs in last 24 hours: Temp:  [98 F (36.7 C)-98.4 F (36.9 C)] 98 F (36.7 C) (04/29 0354) Pulse Rate:  [58-93] 93 (04/29 0354) Resp:  [16-18] 18 (04/29 0354) BP: (106-121)/(65-74) 121/73 (04/29 0354) SpO2:  [99 %-100 %] 99 % (04/29 0354) Weight:  [72.5 kg] 72.5 kg (04/29 0500) Last BM Date: 08/13/18  Intake/Output from previous day: 04/28 0701 - 04/29 0700 In: 240 [P.O.:240] Out: 800 [Urine:600; Stool:200] Intake/Output this shift: No intake/output data recorded.  General appearance: alert and cooperative Resp: clear to auscultation bilaterally Cardio: regular rate and rhythm GI: very soft and nontender. ostomy pink with air and stool in bag  Lab Results:  Recent Labs    08/13/18 0653 08/14/18 0214  WBC 7.0 7.5  HGB 10.4* 7.9*  HCT 32.5* 24.1*  PLT 224 220   BMET Recent Labs    08/13/18 0653 08/14/18 0214  NA 142 139  K 3.3* 4.2  CL 101 100  CO2 32 30  GLUCOSE 80 82  BUN 12 18  CREATININE 0.93 0.94  CALCIUM 8.8* 8.5*   PT/INR No results for input(s): LABPROT, INR in the last 72 hours. ABG No results for input(s): PHART, HCO3 in the last 72 hours.  Invalid input(s): PCO2, PO2  Studies/Results: Dg Abd Portable 1v-small Bowel Obstruction Protocol-24 Hr Delay  Result Date: 08/13/2018 CLINICAL DATA:  Small bowel obstruction.  Less distension today. EXAM: PORTABLE ABDOMEN - 1 VIEW COMPARISON:  08/12/2018 FINDINGS: Enteric tube has tip in the expected region of the gastroesophageal junction unchanged. No dilated small bowel loops. Contrast is present throughout the colon. Evidence of patient's colostomy site over the lower left abdomen/pelvis. No free peritoneal air. Remainder the exam is unchanged. IMPRESSION: Nonobstructive bowel gas pattern with contrast throughout the colon. Colostomy site left lower abdomen/pelvis. Enteric tube unchanged with tip in the  expected region of the gastroesophageal junction. Electronically Signed   By: Marin Olp M.D.   On: 08/13/2018 09:02   Dg Abd Portable 1v-small Bowel Obstruction Protocol-initial, 8 Hr Delay  Result Date: 08/12/2018 CLINICAL DATA:  85 year old female with a history of small bowel obstruction EXAM: PORTABLE ABDOMEN - 1 VIEW COMPARISON:  CT 08/11/2018 FINDINGS: Retained enteric contrast within the length of the colon. Paucity of small bowel gas. Small volume gas within the stomach, which is decompressed from the prior CT. Gastric tube terminates within the stomach. Degenerative changes without displaced fracture. IMPRESSION: Decompressed stomach with gastric tube in place. Enteric contrast throughout the length of the colon. No evidence of small bowel obstruction. Electronically Signed   By: Corrie Mckusick D.O.   On: 08/12/2018 20:19    Anti-infectives: Anti-infectives (From admission, onward)   None      Assessment/Plan: s/p * No surgery found * Advance diet  sbo from parastomal hernia seems to have resolved. Will follow  LOS: 3 days    Anna Harding III 08/14/2018

## 2018-08-14 NOTE — Progress Notes (Signed)
Called Son, [place] and updated him on pt progress.  Granddaughter lives with pt and another lady comes in and is with pt intermittently.  Will update son as needed.

## 2018-08-15 MED ORDER — APIXABAN 5 MG PO TABS
5.0000 mg | ORAL_TABLET | Freq: Two times a day (BID) | ORAL | Status: DC
  Administered 2018-08-15: 12:00:00 5 mg via ORAL
  Filled 2018-08-15: qty 1

## 2018-08-15 NOTE — Progress Notes (Signed)
[facility]/Trauma Progress Note      Assessment/Plan A. Fib- eliquis on hold  Large parastomal hernia containing stomach, colon, and small bowel likely causing a SBO - hernia is soft, having good ostomy output, tolerating diet  - pt is okay for discharge from our standpoint if she is able to tolerate a soft diet - recommend she follow up with her surgeon, Dr. Coralie Keens, at [facility] for further management of her parastomal hernia  UYQ:IHKV diet VTE: SCD's,okay to resume eliquis QQ:VZDG Follow up: as needed   LOS: 4 days    Subjective: CC: hungry  No abdominal pain today. No issues overnight. She wants more to eat. No nausea or vomiting.   Objective: Vital signs in last 24 hours: Temp:  [98.1 F (36.7 C)-98.5 F (36.9 C)] 98.3 F (36.8 C) (04/30 0546) Pulse Rate:  [52-76] 52 (04/30 0546) Resp:  [16-18] 17 (04/30 0546) BP: (92-104)/(59-62) 104/62 (04/30 0546) SpO2:  [96 %-100 %] 96 % (04/30 0546) Weight:  [71.5 kg] 71.5 kg (04/30 0500) Last BM Date: 08/14/18  Intake/Output from previous day: 04/29 0701 - 04/30 0700 In: 362 [P.O.:362] Out: 2400 [Urine:1300; Stool:1100] Intake/Output this shift: Total I/O In: 240 [P.O.:240] Out: -   PE: Gen: Alert, NAD Pulm:rate andeffort normal Abd: Soft, ND, +BS,Large left-sided, soft,parastomal hernia that reduces but immediately recurs,very mildtenderness here without guarding, stoma is pink, no peritonitis, gas and stool in bag. Skin: no rashes noted, warm and dry  Anti-infectives: Anti-infectives (From admission, onward)   None      Lab Results:  Recent Labs    08/13/18 0653 08/14/18 0214 08/14/18 0849  WBC 7.0 7.5  --   HGB 10.4* 7.9* 8.3*  HCT 32.5* 24.1* 25.5*  PLT 224 220  --    BMET Recent Labs    08/13/18 0653 08/14/18 0214  NA 142 139  K 3.3* 4.2  CL 101 100  CO2 32 30  GLUCOSE 80 82  BUN 12 18  CREATININE 0.93 0.94  CALCIUM 8.8* 8.5*   PT/INR No results for  input(s): LABPROT, INR in the last 72 hours. CMP     Component Value Date/Time   NA 139 08/14/2018 0214   K 4.2 08/14/2018 0214   CL 100 08/14/2018 0214   CO2 30 08/14/2018 0214   GLUCOSE 82 08/14/2018 0214   BUN 18 08/14/2018 0214   CREATININE 0.94 08/14/2018 0214   CALCIUM 8.5 (L) 08/14/2018 0214   PROT 6.1 (L) 08/12/2018 0300   ALBUMIN 3.3 (L) 08/12/2018 0300   AST 15 08/12/2018 0300   ALT 13 08/12/2018 0300   ALKPHOS 67 08/12/2018 0300   BILITOT 0.7 08/12/2018 0300   GFRNONAA 56 (L) 08/14/2018 0214   GFRAA >60 08/14/2018 0214   Lipase     Component Value Date/Time   LIPASE 50 08/12/2018 0300    Studies/Results: No results found.    Kalman Drape , [facility] 08/15/2018, 9:21 AM  Pager: [phone] Mon-Wed, Friday 7:00am-4:30pm Thurs 7am-11:30am  Consults: [phone]

## 2018-08-15 NOTE — Care Management Important Message (Signed)
Important Message  Patient Details  Name: Anna Harding MRN: [id number] Date of Birth: 09-12-1932   Medicare Important Message Given:  Yes    Orbie Pyo 08/15/2018, 2:38 PM

## 2018-08-15 NOTE — Progress Notes (Signed)
Anna Harding to be D/C'd Home per MD order.  Discussed prescriptions and follow up appointments with the patient. Prescriptions given to patient, medication list explained in detail. Pt verbalized understanding.  Allergies as of 08/15/2018      Reactions   Wasp Venom Anaphylaxis   Ciprofloxacin    Codeine Other (See Comments)   Dizziness   Furosemide Nausea Only   Penicillins    unknown   Prednisone    unknown   Sulfa Antibiotics Hives   Warfarin And Related    Unknown   Tape Rash   Adhesive tape      Medication List    TAKE these medications   apixaban 5 MG Tabs tablet Commonly known as:  ELIQUIS Take 5 mg by mouth 2 (two) times daily.   diltiazem 180 MG 24 hr capsule Commonly known as:  TIAZAC Take 180 mg by mouth daily.   hydrALAZINE 50 MG tablet Commonly known as:  APRESOLINE Take 50 mg by mouth 2 (two) times a day.   multivitamin with minerals tablet Take 1 tablet by mouth daily.   omeprazole 40 MG capsule Commonly known as:  PRILOSEC Take 40 mg by mouth 2 (two) times a day.   torsemide 20 MG tablet Commonly known as:  DEMADEX Take 20 mg by mouth 2 (two) times a day.   traMADol 50 MG tablet Commonly known as:  ULTRAM Take 50 mg by mouth every 6 (six) hours as needed for pain.   vitamin C with rose hips 1000 MG tablet Take 1,000 mg by mouth daily.       Vitals:   08/15/18 1158 08/15/18 1320  BP: 112/68 107/71  Pulse:  76  Resp:  16  Temp:  98.2 F (36.8 C)  SpO2:  98%    Skin clean, dry and intact without evidence of skin break down, skin tear on left lower abdomen, colostomy is clean, dry, and intact. Patient is aware on how to maintain and clean colostomy at home. RN still provided education to patient. IV catheter discontinued intact. Site without signs and symptoms of complications. Dressing and pressure applied. Pt denies pain at this time. No complaints noted.  An After Visit Summary was printed and given to the patient. Patient escorted  via [place], and D/C home via private auto.  [place] RN

## 2018-08-15 NOTE — Evaluation (Signed)
Physical Therapy Evaluation Patient Details Name: Anna Harding MRN: [id number] DOB: 04-Dec-1932 Today's Date: 08/15/2018   History of Present Illness  Pt is an 85 y.o. female admitted 08/11/18 with abdominal cramps and vomiting. Abdominal CT showed large peristromal hernia with concern for entrapment and developing bowel ischemia. PMH includes afib on Eliquis, diverticulitis requiring colectomy with ostomy.    Clinical Impression  Pt presents with an overall decrease in functional mobility secondary to above. PTA, pt ambulatory with rollator, has assist from granddaughter and weekly caretakers. Today, to pt moving well with RW at supervision-level; remains limited by decreased activity tolerance and generalized weakness. Expect pt functioning near baseline mobility. Pt would benefit from continued acute PT services to maximize functional mobility and independence prior to d/c home with continued family support.     Follow Up Recommendations No PT follow up;Supervision for mobility/OOB    Equipment Recommendations  None recommended by PT    Recommendations for Other Services       Precautions / Restrictions Precautions Precautions: Fall Precaution Comments: Ostomy Restrictions Weight Bearing Restrictions: No      Mobility  Bed Mobility               General bed mobility comments: Received sitting in recliner  Transfers Overall transfer level: Needs assistance Equipment used: Rolling walker (2 wheeled) Transfers: Sit to/from Stand Sit to Stand: Supervision         General transfer comment: Reliant on momentum to power into standing to RW; supervision for safety  Ambulation/Gait Ambulation/Gait assistance: Min guard;Supervision Gait Distance (Feet): 62 Feet Assistive device: Rolling walker (2 wheeled) Gait Pattern/deviations: Step-through pattern;Decreased stride length;Trunk flexed Gait velocity: Decreased Gait velocity interpretation: <1.8 ft/sec, indicate of risk  for recurrent falls General Gait Details: Slow, fatigued gait with RW, progressing to supervision for safety. Pt concerned with urine incontinence  Stairs            Wheelchair Mobility    Modified Rankin (Stroke Patients Only)       Balance Overall balance assessment: Needs assistance   Sitting balance-Leahy Scale: Good       Standing balance-Leahy Scale: Fair Standing balance comment: Can static stand without UE support; requires UE support for dynamic stability                             Pertinent Vitals/Pain Pain Assessment: Faces Faces Pain Scale: Hurts a little bit Pain Location: Hernia/ostomy site Pain Descriptors / Indicators: Discomfort Pain Intervention(s): Monitored during session    Home Living Family/patient expects to be discharged to:: Private residence Living Arrangements: Other relatives Available Help at Discharge: Family;Available 24 hours/day;Personal care attendant Type of Home: House Home Access: Level entry     Home Layout: One level Home Equipment: Walker - 2 wheels;Walker - 4 wheels;Cane - single point;Wheelchair - Sport and exercise psychologist Comments: Lives with great-granddaughter who provides assist. Also has caretaker service 3x/wk     Prior Function Level of Independence: Needs assistance   Gait / Transfers Assistance Needed: Household ambulation by furniture surfing or use of rollator  ADL's / Homemaking Assistance Needed: Granddaughter and caretakers assist with ADLs and household tasks as needed. Pt reports indep to care for ostomy bag        Hand Dominance        Extremity/Trunk Assessment   Upper Extremity Assessment Upper Extremity Assessment: Generalized weakness    Lower Extremity Assessment Lower Extremity Assessment: Generalized weakness  Cervical / Trunk Assessment Cervical / Trunk Assessment: Kyphotic  Communication   Communication: No difficulties  Cognition Arousal/Alertness:  Awake/alert Behavior During Therapy: WFL for tasks assessed/performed Overall Cognitive Status: Within Functional Limits for tasks assessed                                        General Comments      Exercises     Assessment/Plan    PT Assessment Patient needs continued PT services  PT Problem List Decreased strength;Decreased activity tolerance;Decreased balance;Decreased mobility       PT Treatment Interventions DME instruction;Gait training;Functional mobility training;Therapeutic activities;Therapeutic exercise;Balance training;Patient/family education    PT Goals (Current goals can be found in the Care Plan section)  Acute Rehab PT Goals Patient Stated Goal: Return home today PT Goal Formulation: With patient Time For Goal Achievement: 08/29/18 Potential to Achieve Goals: Good    Frequency Min 3X/week   Barriers to discharge        Co-evaluation               AM-PAC PT "6 Clicks" Mobility  Outcome Measure Help needed turning from your back to your side while in a flat bed without using bedrails?: A Little Help needed moving from lying on your back to sitting on the side of a flat bed without using bedrails?: A Little Help needed moving to and from a bed to a chair (including a wheelchair)?: A Little Help needed standing up from a chair using your arms (e.g., wheelchair or bedside chair)?: A Little Help needed to walk in hospital room?: A Little Help needed climbing 3-5 steps with a railing? : A Little 6 Click Score: 18    End of Session   Activity Tolerance: Patient tolerated treatment well Patient left: in chair;with call bell/phone within reach Nurse Communication: Mobility status PT Visit Diagnosis: Other abnormalities of gait and mobility (R26.89)    Time: 8099-8338 PT Time Calculation (min) (ACUTE ONLY): 13 min   Charges:   PT Evaluation $PT Eval Moderate Complexity: [place], PT, DPT Acute  Rehabilitation Services  Pager [phone] Office [place] 08/15/2018, 12:23 PM

## 2018-08-15 NOTE — Progress Notes (Signed)
[place] for Apixaban resumption Indication: atrial fibrillation  Allergies  Allergen Reactions  . Wasp Venom Anaphylaxis  . Ciprofloxacin   . Codeine Other (See Comments)    Dizziness  . Furosemide Nausea Only  . Penicillins     unknown  . Prednisone     unknown  . Sulfa Antibiotics Hives  . Warfarin And Related     Unknown   . Tape Rash    Adhesive tape   Patient Measurements: Weight: 157 lb 10.1 oz (71.5 kg)  Vital Signs: Temp: 98.3 F (36.8 C) (04/30 0546) Temp Source: Oral (04/30 0546) BP: 104/62 (04/30 0546) Pulse Rate: 52 (04/30 0546)  Labs: Recent Labs    08/12/18 1820  08/13/18 0653 08/14/18 0214 08/14/18 0849  HGB  --    < > 10.4* 7.9* 8.3*  HCT  --   --  32.5* 24.1* 25.5*  PLT  --   --  224 220  --   CREATININE 0.93  --  0.93 0.94  --    < > = values in this interval not displayed.    CrCl cannot be calculated (Unknown ideal weight.).  Medical History: Past Medical History:  Diagnosis Date  . A-fib ([place])   . Carcinoma of cervix ([place])   . Diverticulitis 11/29/2017  . GERD (gastroesophageal reflux disease)   . Hiatal hernia   . HTN (hypertension)   . Hypothyroidism     Medications:  Scheduled:  . apixaban  5 mg Oral BID  . diltiazem  180 mg Oral Daily  . pantoprazole  40 mg Oral Daily  . potassium chloride  20 mEq Oral Daily  . torsemide  20 mg Oral BID    Assessment: 40 yoF w/parastomal hernia- reducible > SBO  PMH: Afib on Apixaban 5mg  bid, LD 4/26 (6p)  Goal of Therapy:  Monitor platelets by anticoagulation protocol: Yes   Plan:  Discontinue SQ Heparin, last dose 0600 Resume PTA Apixaban 5mg  bid, first dose 12n Monitor CBC, s/s bleed  Minda Ditto  [phone] 08/15/2018,8:13 AM

## 2018-08-15 NOTE — Evaluation (Signed)
Occupational Therapy Evaluation Patient Details Name: Anna Harding MRN: [id number] DOB: 12/30/1932 Today's Date: 08/15/2018    History of Present Illness Pt is an 85 y.o. female admitted 08/11/18 with abdominal cramps and vomiting. Abdominal CT showed large peristromal hernia with concern for entrapment and developing bowel ischemia. PMH includes afib on Eliquis, diverticulitis requiring colectomy with ostomy.   Clinical Impression   OT education complete.  Pts family will A as needed    Follow Up Recommendations  No OT follow up;Supervision/Assistance - 24 hour    Equipment Recommendations  None recommended by OT    Recommendations for Other Services       Precautions / Restrictions Precautions Precautions: Fall Precaution Comments: Ostomy Restrictions Weight Bearing Restrictions: No      Mobility Bed Mobility               General bed mobility comments: OOB  Transfers Overall transfer level: Needs assistance Equipment used: Rolling walker (2 wheeled) Transfers: Sit to/from Stand Sit to Stand: Supervision         General transfer comment: Reliant on momentum to power into standing to RW; supervision for safety    Balance Overall balance assessment: Needs assistance   Sitting balance-Leahy Scale: Good       Standing balance-Leahy Scale: Fair Standing balance comment: Can static stand without UE support; requires UE support for dynamic stability                           ADL either performed or assessed with clinical judgement   ADL Overall ADL's : Needs assistance/impaired                                       General ADL Comments: Pt overall S-min A  with ADL activity.  Pts family helps pt as needed. Pt states she is I with colostomy care. Has all needed DME     Vision Patient Visual Report: No change from baseline              Pertinent Vitals/Pain Pain Assessment: No/denies pain     Hand Dominance      Extremity/Trunk Assessment         Cervical / Trunk Assessment Cervical / Trunk Assessment: Kyphotic   Communication Communication Communication: No difficulties   Cognition Arousal/Alertness: Awake/alert Behavior During Therapy: WFL for tasks assessed/performed Overall Cognitive Status: Within Functional Limits for tasks assessed                                                Home Living Family/patient expects to be discharged to:: Private residence Living Arrangements: Other relatives Available Help at Discharge: Family;Available 24 hours/day;Personal care attendant Type of Home: House Home Access: Level entry     Home Layout: One level     Bathroom Shower/Tub: Teacher, early years/pre: Handicapped height     Home Equipment: Environmental consultant - 2 wheels;Walker - 4 wheels;Cane - single point;Wheelchair - Biomedical scientist Comments: Lives with great-granddaughter who provides assist. Also has caretaker service 3x/wk       Prior Functioning/Environment Level of Independence: Needs assistance  Gait / Transfers Assistance Needed: Household ambulation by furniture surfing or use of rollator ADL's /  Homemaking Assistance Needed: Granddaughter and caretakers assist with ADLs and household tasks as needed. Pt reports indep to care for ostomy bag                     OT Goals(Current goals can be found in the care plan section) Acute Rehab OT Goals Patient Stated Goal: Return home today  OT Frequency:      AM-PAC OT "6 Clicks" Daily Activity     Outcome Measure Help from another person eating meals?: None Help from another person taking care of personal grooming?: None Help from another person toileting, which includes using toliet, bedpan, or urinal?: A Little Help from another person bathing (including washing, rinsing, drying)?: A Little Help from another person to put on and taking off regular upper body clothing?: None Help  from another person to put on and taking off regular lower body clothing?: A Little 6 Click Score: 21   End of Session Equipment Utilized During Treatment: Rolling walker Nurse Communication: Mobility status  Activity Tolerance: Patient tolerated treatment well Patient left: in chair;with call bell/phone within reach                   Time: 1420-1443 OT Time Calculation (min): 23 min Charges:  OT General Charges $OT Visit: 1 Visit OT Evaluation $OT Eval Moderate Complexity: 1 Mod  Kari Baars, OT Acute Rehabilitation Services [phone] Office- [phone]     Riyaan Heroux, Edwena Felty D 08/15/2018, 4:28 PM

## 2018-08-15 NOTE — Care Management Important Message (Signed)
Important Message  Patient Details  Name: Anna Harding MRN: [id number] Date of Birth: 1932-06-30   Medicare Important Message Given:  Yes    Orbie Pyo 08/15/2018, 2:29 PM

## 2018-08-15 NOTE — Progress Notes (Signed)
d/c pending soft diet toleration and colostomy output per MD note

## 2018-08-15 NOTE — Discharge Summary (Signed)
Physician Discharge Summary  Anna Harding ZOX:[id number] DOB: 12-15-1932 DOA: 08/11/2018  PCP: Patient, No Pcp Per  Admit date: 08/11/2018 Discharge date: 08/15/2018  Admitted From: Inpatient Disposition: home  Recommendations for Outpatient Follow-up:  1. Follow up with PCP in 1-2 weeks 2. Please obtain BMP/CBC in one week 3. Please follow up on the following pending results:  Home Health:No Equipment/Devices:none  Discharge Condition:Stable CODE STATUS:Full code Diet recommendation: soft  Brief/Interim Summary: Mrs. Tysonis a 85 y.o.Fwith Afib on Eliquis and diverticulitis requiring colectomy with ostomy who presented with abdominal pain, cramps, vomiting for 24 hours.  In the ER at [facility], CT abdomen showed large peristomal hernia with concern for entrapment and developing bowel ischemia.  Patient was subsequently transferred to [facility] for further eval, seen by the hospital service admitted for further eval and treatment.  Hospital course: Small bowel obstruction with parastomal hernia.  Patient was seen by general surgery no operative intervention was required bowel obstruction cleared patient was passing formed stool tolerating diet without complication.  She stable for discharge home today.  Atrial fibrillation.  Patient's anticoagulation was initially held secondary to possibility of surgical intervention.  She was continued on her diltiazem.  Her Eliquis was restarted in the hospital and she will continue with outpatient follow-up.  Hypertension patient continue home medications at home.  Hypokalemia and hypomagnesemia.  Mag was repleted.  Potassium was supplemented and noted to be normal there was no acute arrhythmias no further work-up necessary.  Normocytic anemia.  Patient was noted to have a decreased hemoglobin 10.4-7.9 on 1 lab.  Recheck showed was 8.3.  Patient's been stable this was likely dilutional in the setting of fluid resuscitation for small bowel  reduction.  Discharge Diagnoses:  Active Problems:   Abdominal pain   AF (paroxysmal atrial fibrillation) (HCC)   Hypokalemia   Serum lipase elevation    Discharge Instructions  Discharge Instructions    Call MD for:   Complete by:  As directed    Call PCP or return to local ER for any acute change in medical condition to include but not limited to nausea vomiting fevers chills chest pain shortness of breath   Diet general   Complete by:  As directed    Soft diet   Discharge instructions   Complete by:  As directed    Call family doctor or return to local emergency room for any acute change in medical condition to include but not limited to nausea vomiting fevers chills chest pain or shortness of breath   Increase activity slowly   Complete by:  As directed      Allergies as of 08/15/2018      Reactions   Wasp Venom Anaphylaxis   Ciprofloxacin    Codeine Other (See Comments)   Dizziness   Furosemide Nausea Only   Penicillins    unknown   Prednisone    unknown   Sulfa Antibiotics Hives   Warfarin And Related    Unknown   Tape Rash   Adhesive tape      Medication List    TAKE these medications   apixaban 5 MG Tabs tablet Commonly known as:  ELIQUIS Take 5 mg by mouth 2 (two) times daily.   diltiazem 180 MG 24 hr capsule Commonly known as:  TIAZAC Take 180 mg by mouth daily.   hydrALAZINE 50 MG tablet Commonly known as:  APRESOLINE Take 50 mg by mouth 2 (two) times a day.   multivitamin with minerals tablet  Take 1 tablet by mouth daily.   omeprazole 40 MG capsule Commonly known as:  PRILOSEC Take 40 mg by mouth 2 (two) times a day.   torsemide 20 MG tablet Commonly known as:  DEMADEX Take 20 mg by mouth 2 (two) times a day.   traMADol 50 MG tablet Commonly known as:  ULTRAM Take 50 mg by mouth every 6 (six) hours as needed for pain.   vitamin C with [place] 1000 MG tablet Take 1,000 mg by mouth daily.       Allergies  Allergen Reactions   . Wasp Venom Anaphylaxis  . Ciprofloxacin   . Codeine Other (See Comments)    Dizziness  . Furosemide Nausea Only  . Penicillins     unknown  . Prednisone     unknown  . Sulfa Antibiotics Hives  . Warfarin And Related     Unknown   . Tape Rash    Adhesive tape    Consultations:  General surgery   Procedures/Studies: Dg Abd Portable 1v-small Bowel Obstruction Protocol-24 Hr Delay  Result Date: 08/13/2018 CLINICAL DATA:  Small bowel obstruction.  Less distension today. EXAM: PORTABLE ABDOMEN - 1 VIEW COMPARISON:  08/12/2018 FINDINGS: Enteric tube has tip in the expected region of the gastroesophageal junction unchanged. No dilated small bowel loops. Contrast is present throughout the colon. Evidence of patient's colostomy site over the lower left abdomen/pelvis. No free peritoneal air. Remainder the exam is unchanged. IMPRESSION: Nonobstructive bowel gas pattern with contrast throughout the colon. Colostomy site left lower abdomen/pelvis. Enteric tube unchanged with tip in the expected region of the gastroesophageal junction. Electronically Signed   By: Marin Olp M.D.   On: 08/13/2018 09:02   Dg Abd Portable 1v-small Bowel Obstruction Protocol-initial, 8 Hr Delay  Result Date: 08/12/2018 CLINICAL DATA:  85 year old female with a history of small bowel obstruction EXAM: PORTABLE ABDOMEN - 1 VIEW COMPARISON:  CT 08/11/2018 FINDINGS: Retained enteric contrast within the length of the colon. Paucity of small bowel gas. Small volume gas within the stomach, which is decompressed from the prior CT. Gastric tube terminates within the stomach. Degenerative changes without displaced fracture. IMPRESSION: Decompressed stomach with gastric tube in place. Enteric contrast throughout the length of the colon. No evidence of small bowel obstruction. Electronically Signed   By: Corrie Mckusick D.O.   On: 08/12/2018 20:19       Subjective: No acute events overnight.  Patient reports doing well no  bowel pain tolerating diet well reported she is very hungry would like more food.  Discharge Exam: Vitals:   08/14/18 2011 08/15/18 0546  BP: (!) 92/59 104/62  Pulse: 76 (!) 52  Resp: 18 17  Temp: 98.1 F (36.7 C) 98.3 F (36.8 C)  SpO2: 96% 96%   Vitals:   08/14/18 1353 08/14/18 2011 08/15/18 0500 08/15/18 0546  BP: (!) 95/59 (!) 92/59  104/62  Pulse: 61 76  (!) 52  Resp: 16 18  17   Temp: 98.5 F (36.9 C) 98.1 F (36.7 C)  98.3 F (36.8 C)  TempSrc: Oral Oral  Oral  SpO2: 100% 96%  96%  Weight:   71.5 kg     General: Pt is alert, awake, not in acute distress Cardiovascular: RRR, S1/S2 +, no rubs, no gallops Respiratory: CTA bilaterally, no wheezing, no rhonchi Abdominal: Soft, NT, ND, bowel sounds +, ostomy bag left lower quadrant stable no leaking filled with stool Extremities: no edema, no cyanosis    The results of significant  diagnostics from this hospitalization (including imaging, microbiology, ancillary and laboratory) are listed below for reference.     Microbiology: No results found for this or any previous visit (from the past 240 hour(s)).   Labs: BNP (last 3 results) No results for input(s): BNP in the last 8760 hours. Basic Metabolic Panel: Recent Labs  Lab 08/12/18 0300 08/12/18 0832 08/12/18 1820 08/13/18 0653 08/14/18 0214  NA 145 146* 144 142 139  K 2.3* 2.7* 3.4* 3.3* 4.2  CL 95* 98 99 101 100  CO2 40* 39* 37* 32 30  GLUCOSE 111* 104* 92 80 82  BUN 15 13 11 12 18   CREATININE 0.93 0.94 0.93 0.93 0.94  CALCIUM 8.9 8.7* 8.7* 8.8* 8.5*  MG 2.9*  --   --   --   --    Liver Function Tests: Recent Labs  Lab 08/12/18 0300  AST 15  ALT 13  ALKPHOS 67  BILITOT 0.7  PROT 6.1*  ALBUMIN 3.3*   Recent Labs  Lab 08/12/18 0300  LIPASE 50   No results for input(s): AMMONIA in the last 168 hours. CBC: Recent Labs  Lab 08/12/18 0300 08/13/18 0653 08/14/18 0214 08/14/18 0849  WBC 9.1 7.0 7.5  --   HGB 10.0* 10.4* 7.9* 8.3*  HCT  30.3* 32.5* 24.1* 25.5*  MCV 86.1 87.8 86.7  --   PLT 252 224 220  --    Cardiac Enzymes: No results for input(s): CKTOTAL, CKMB, CKMBINDEX, TROPONINI in the last 168 hours. BNP: Invalid input(s): POCBNP CBG: No results for input(s): GLUCAP in the last 168 hours. D-Dimer No results for input(s): DDIMER in the last 72 hours. Hgb A1c No results for input(s): HGBA1C in the last 72 hours. Lipid Profile No results for input(s): CHOL, HDL, LDLCALC, TRIG, CHOLHDL, LDLDIRECT in the last 72 hours. Thyroid function studies No results for input(s): TSH, T4TOTAL, T3FREE, THYROIDAB in the last 72 hours.  Invalid input(s): FREET3 Anemia work up No results for input(s): VITAMINB12, FOLATE, FERRITIN, TIBC, IRON, RETICCTPCT in the last 72 hours. Urinalysis No results found for: COLORURINE, APPEARANCEUR, LABSPEC, [place], GLUCOSEU, HGBUR, BILIRUBINUR, KETONESUR, PROTEINUR, UROBILINOGEN, NITRITE, LEUKOCYTESUR Sepsis Labs Invalid input(s): PROCALCITONIN,  WBC,  LACTICIDVEN Microbiology No results found for this or any previous visit (from the past 240 hour(s)).   Time coordinating discharge: 35  minutes  SIGNED:   Nicolette Bang, MD  [facility] 08/15/2018, 10:23 AM Pager   If 7PM-7AM, please contact night-coverage [email or web address] Password [email or web address]

## 2018-08-26 DIAGNOSIS — R1310 Dysphagia, unspecified: Secondary | ICD-10-CM | POA: Diagnosis not present

## 2018-08-26 DIAGNOSIS — I4821 Permanent atrial fibrillation: Secondary | ICD-10-CM | POA: Diagnosis not present

## 2018-08-26 DIAGNOSIS — I5033 Acute on chronic diastolic (congestive) heart failure: Secondary | ICD-10-CM | POA: Diagnosis not present

## 2018-08-26 DIAGNOSIS — K435 Parastomal hernia without obstruction or  gangrene: Secondary | ICD-10-CM | POA: Diagnosis not present

## 2018-08-26 DIAGNOSIS — R627 Adult failure to thrive: Secondary | ICD-10-CM | POA: Diagnosis not present

## 2018-09-13 DIAGNOSIS — E876 Hypokalemia: Secondary | ICD-10-CM | POA: Diagnosis not present

## 2018-09-23 DIAGNOSIS — Z7901 Long term (current) use of anticoagulants: Secondary | ICD-10-CM | POA: Diagnosis not present

## 2018-09-23 DIAGNOSIS — I10 Essential (primary) hypertension: Secondary | ICD-10-CM | POA: Diagnosis not present

## 2018-09-23 DIAGNOSIS — I4819 Other persistent atrial fibrillation: Secondary | ICD-10-CM | POA: Diagnosis not present

## 2018-10-24 DIAGNOSIS — Z9049 Acquired absence of other specified parts of digestive tract: Secondary | ICD-10-CM | POA: Diagnosis not present

## 2018-10-24 DIAGNOSIS — E876 Hypokalemia: Secondary | ICD-10-CM | POA: Diagnosis not present

## 2018-10-24 DIAGNOSIS — Z8673 Personal history of transient ischemic attack (TIA), and cerebral infarction without residual deficits: Secondary | ICD-10-CM | POA: Diagnosis not present

## 2018-10-24 DIAGNOSIS — R112 Nausea with vomiting, unspecified: Secondary | ICD-10-CM | POA: Diagnosis not present

## 2018-10-24 DIAGNOSIS — I482 Chronic atrial fibrillation, unspecified: Secondary | ICD-10-CM | POA: Diagnosis not present

## 2018-10-24 DIAGNOSIS — I10 Essential (primary) hypertension: Secondary | ICD-10-CM | POA: Diagnosis present

## 2018-10-24 DIAGNOSIS — R188 Other ascites: Secondary | ICD-10-CM | POA: Diagnosis not present

## 2018-10-24 DIAGNOSIS — Z66 Do not resuscitate: Secondary | ICD-10-CM | POA: Diagnosis not present

## 2018-10-24 DIAGNOSIS — Z933 Colostomy status: Secondary | ICD-10-CM | POA: Diagnosis not present

## 2018-10-24 DIAGNOSIS — M19042 Primary osteoarthritis, left hand: Secondary | ICD-10-CM | POA: Diagnosis present

## 2018-10-24 DIAGNOSIS — R58 Hemorrhage, not elsewhere classified: Secondary | ICD-10-CM | POA: Diagnosis not present

## 2018-10-24 DIAGNOSIS — M19041 Primary osteoarthritis, right hand: Secondary | ICD-10-CM | POA: Diagnosis present

## 2018-10-24 DIAGNOSIS — K433 Parastomal hernia with obstruction, without gangrene: Secondary | ICD-10-CM | POA: Diagnosis not present

## 2018-10-24 DIAGNOSIS — Z7901 Long term (current) use of anticoagulants: Secondary | ICD-10-CM | POA: Diagnosis not present

## 2018-10-24 DIAGNOSIS — K439 Ventral hernia without obstruction or gangrene: Secondary | ICD-10-CM | POA: Diagnosis not present

## 2018-10-24 DIAGNOSIS — K311 Adult hypertrophic pyloric stenosis: Secondary | ICD-10-CM | POA: Diagnosis not present

## 2018-10-24 DIAGNOSIS — K922 Gastrointestinal hemorrhage, unspecified: Secondary | ICD-10-CM | POA: Diagnosis not present

## 2018-10-24 DIAGNOSIS — K56609 Unspecified intestinal obstruction, unspecified as to partial versus complete obstruction: Secondary | ICD-10-CM | POA: Diagnosis not present

## 2018-10-24 DIAGNOSIS — K469 Unspecified abdominal hernia without obstruction or gangrene: Secondary | ICD-10-CM | POA: Diagnosis not present

## 2018-10-24 DIAGNOSIS — Z8719 Personal history of other diseases of the digestive system: Secondary | ICD-10-CM | POA: Diagnosis not present

## 2018-10-24 DIAGNOSIS — E039 Hypothyroidism, unspecified: Secondary | ICD-10-CM | POA: Diagnosis present

## 2018-10-24 DIAGNOSIS — M170 Bilateral primary osteoarthritis of knee: Secondary | ICD-10-CM | POA: Diagnosis present

## 2018-10-24 DIAGNOSIS — K8689 Other specified diseases of pancreas: Secondary | ICD-10-CM | POA: Diagnosis not present

## 2018-10-24 DIAGNOSIS — Z79899 Other long term (current) drug therapy: Secondary | ICD-10-CM | POA: Diagnosis not present

## 2018-10-24 DIAGNOSIS — K219 Gastro-esophageal reflux disease without esophagitis: Secondary | ICD-10-CM | POA: Diagnosis present

## 2018-10-24 DIAGNOSIS — R4020 Unspecified coma: Secondary | ICD-10-CM | POA: Diagnosis not present

## 2018-10-24 DIAGNOSIS — J449 Chronic obstructive pulmonary disease, unspecified: Secondary | ICD-10-CM | POA: Diagnosis present

## 2018-10-26 ENCOUNTER — Inpatient Hospital Stay: Admission: AD | Admit: 2018-10-26 | Payer: Medicare Other | Admitting: Pulmonary Disease

## 2018-10-26 DIAGNOSIS — I482 Chronic atrial fibrillation, unspecified: Secondary | ICD-10-CM

## 2018-11-16 DEATH — deceased

## 2018-11-18 ENCOUNTER — Other Ambulatory Visit: Payer: Self-pay

## 2021-04-18 IMAGING — DX PORTABLE ABDOMEN - 1 VIEW
1 series · 1 of 1 positions shown · non-contrast
Comparison: CT 08/11/2018

CLINICAL DATA: 85-year-old female with a history of small bowel
obstruction

EXAM:
PORTABLE ABDOMEN - 1 VIEW

[abdomen]
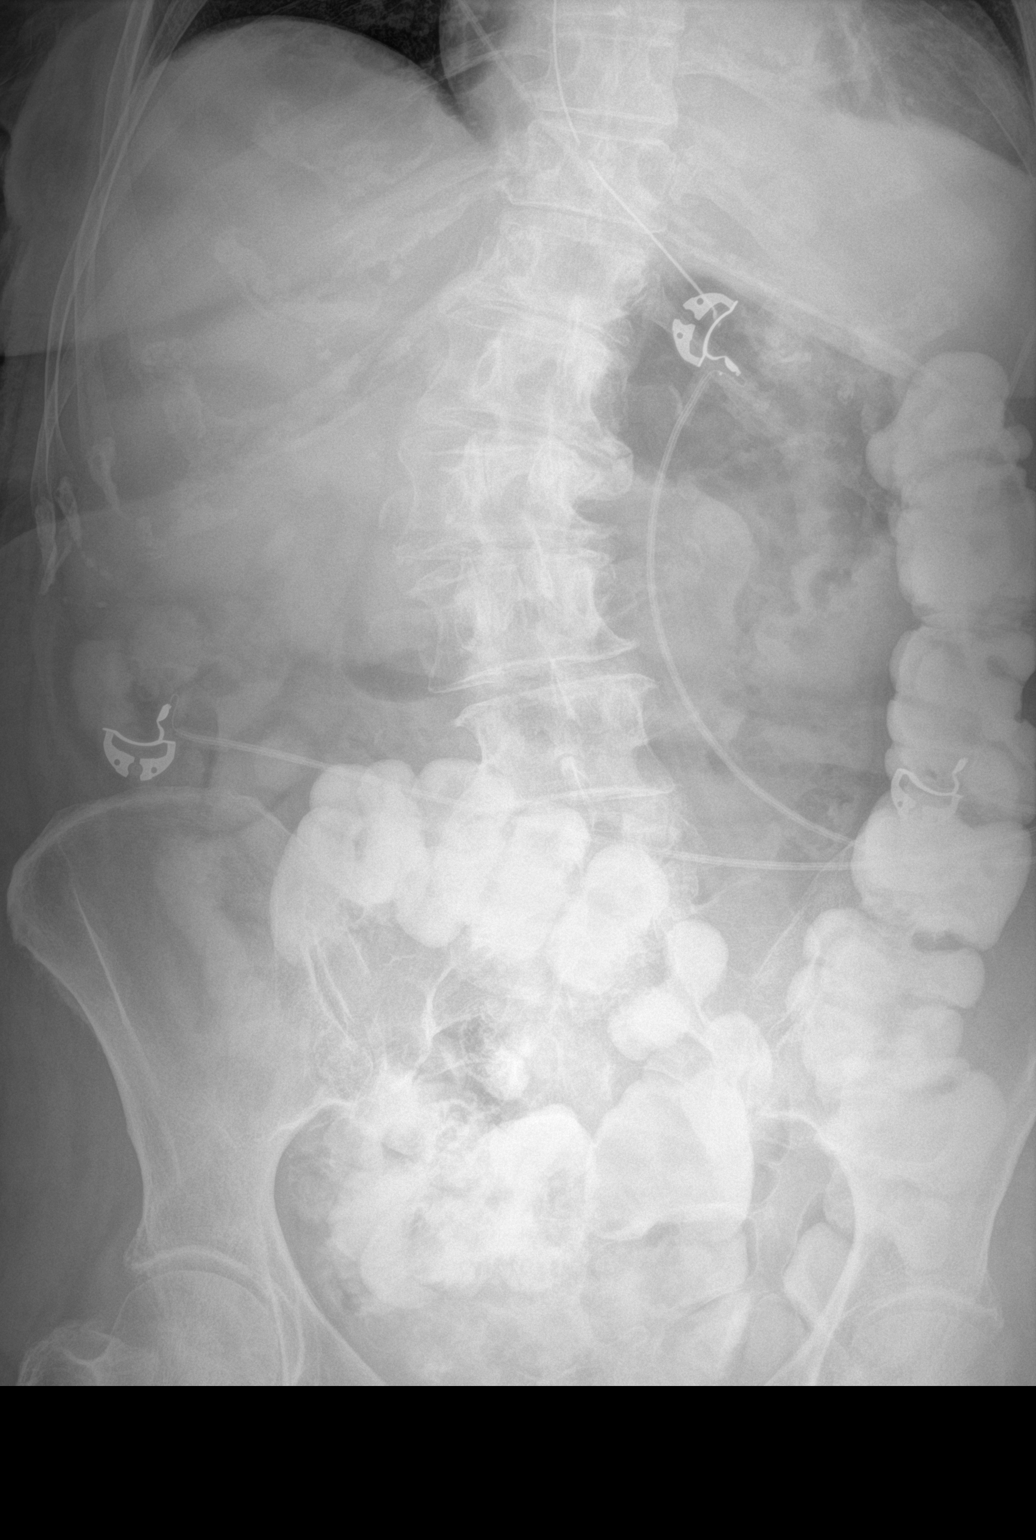

[1 of 1 positions shown; findings below may reference images not displayed]

FINDINGS: Retained enteric contrast within the length of the colon. Paucity of
small bowel gas. Small volume gas within the stomach, which is
decompressed from the prior CT. Gastric tube terminates within the
stomach.

Degenerative changes without displaced fracture.
IMPRESSION: Decompressed stomach with gastric tube in place.

Enteric contrast throughout the length of the colon. No evidence of
small bowel obstruction.

## 2021-04-19 IMAGING — DX PORTABLE ABDOMEN - 1 VIEW
1 series · 1 of 1 positions shown · non-contrast
Comparison: 08/12/2018

CLINICAL DATA: Small bowel obstruction.  Less distension today.

EXAM:
PORTABLE ABDOMEN - 1 VIEW

[abdomen kub]
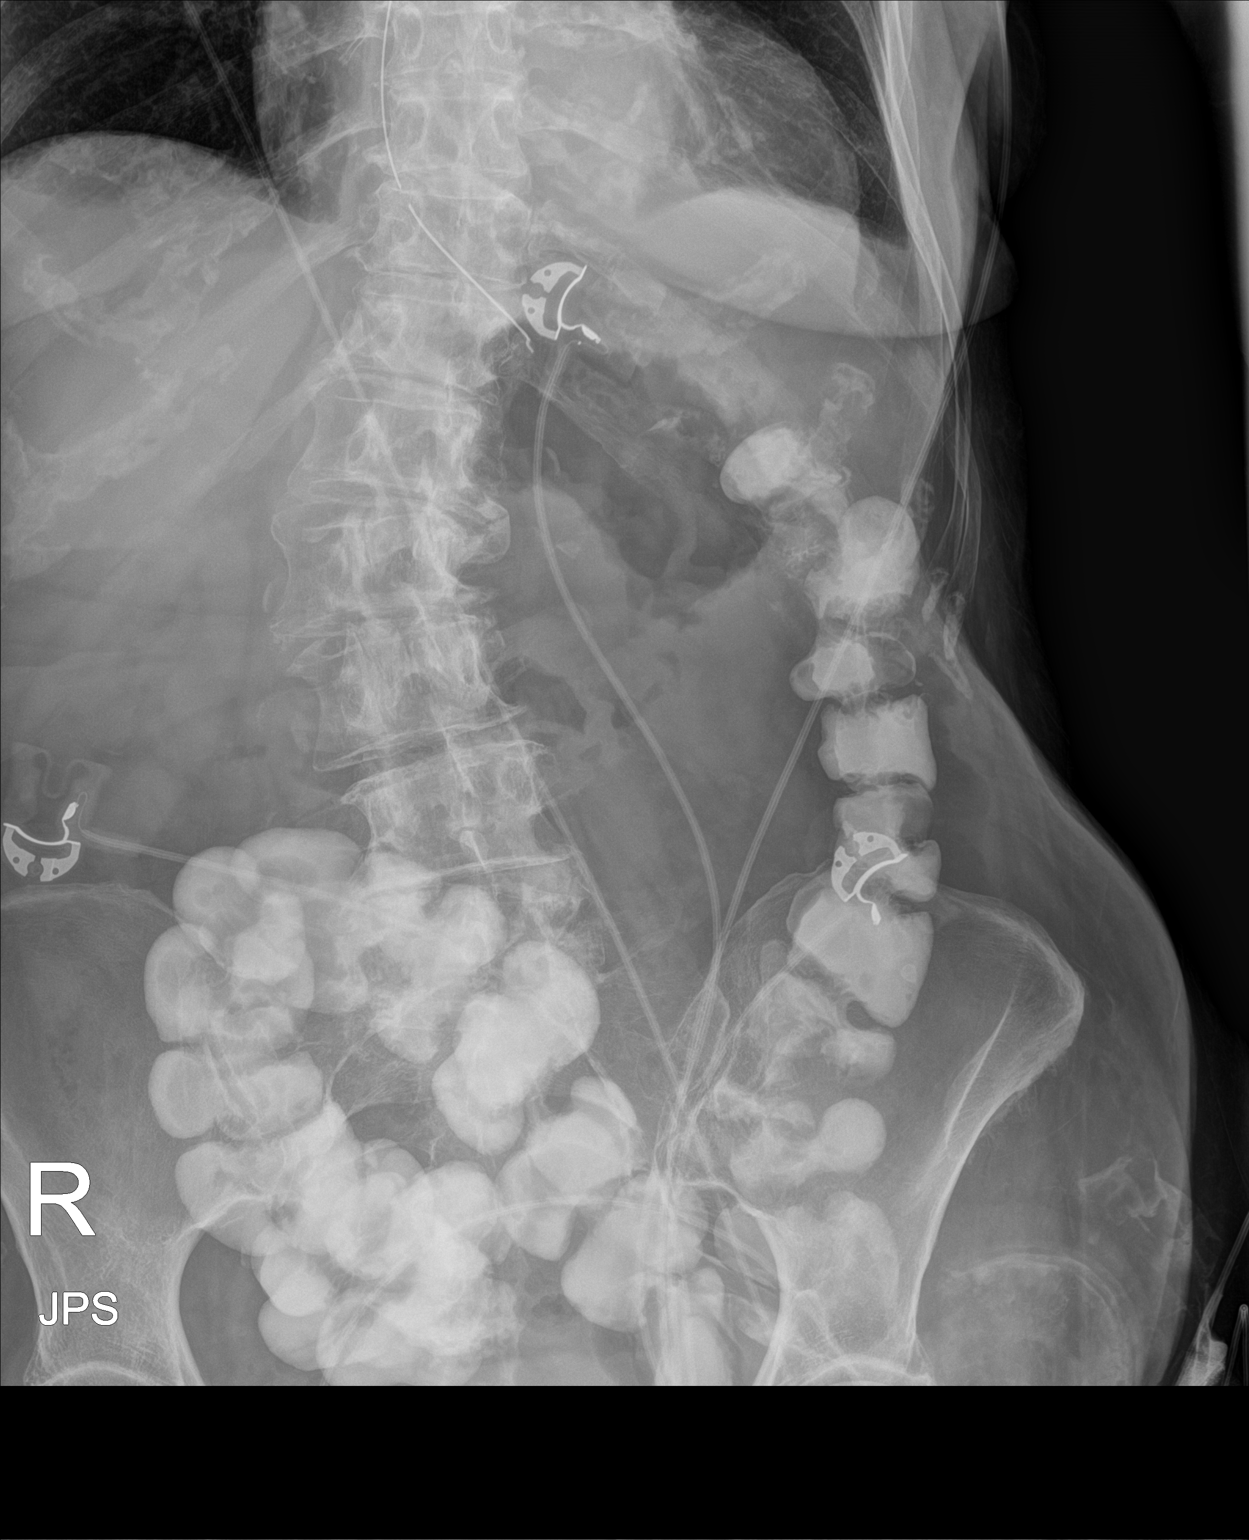

[1 of 1 positions shown; findings below may reference images not displayed]

FINDINGS: Enteric tube has tip in the expected region of the gastroesophageal
junction unchanged. No dilated small bowel loops. Contrast is
present throughout the colon. Evidence of patient's colostomy site
over the lower left abdomen/pelvis. No free peritoneal air.
Remainder the exam is unchanged.
IMPRESSION: Nonobstructive bowel gas pattern with contrast throughout the colon.
Colostomy site left lower abdomen/pelvis.

Enteric tube unchanged with tip in the expected region of the
gastroesophageal junction.
